# Patient Record
Sex: Male | Born: 1955
Health system: Southern US, Community
[De-identification: ages and names within clinical notes are randomized; demographics above are authoritative.]

## PROBLEM LIST (undated history)

## (undated) ENCOUNTER — Emergency Department (HOSPITAL_COMMUNITY): Discharge status: Absent without leave | Payer: Medicare Other

## (undated) DIAGNOSIS — I2510 Atherosclerotic heart disease of native coronary artery without angina pectoris: Secondary | ICD-10-CM

## (undated) DIAGNOSIS — R569 Unspecified convulsions: Secondary | ICD-10-CM

## (undated) DIAGNOSIS — I219 Acute myocardial infarction, unspecified: Secondary | ICD-10-CM

## (undated) DIAGNOSIS — K219 Gastro-esophageal reflux disease without esophagitis: Secondary | ICD-10-CM

## (undated) DIAGNOSIS — J449 Chronic obstructive pulmonary disease, unspecified: Secondary | ICD-10-CM

## (undated) DIAGNOSIS — Z9119 Patient's noncompliance with other medical treatment and regimen: Secondary | ICD-10-CM

## (undated) DIAGNOSIS — I509 Heart failure, unspecified: Secondary | ICD-10-CM

## (undated) DIAGNOSIS — IMO0001 Reserved for inherently not codable concepts without codable children: Secondary | ICD-10-CM

## (undated) DIAGNOSIS — I201 Angina pectoris with documented spasm: Secondary | ICD-10-CM

## (undated) DIAGNOSIS — I639 Cerebral infarction, unspecified: Secondary | ICD-10-CM

## (undated) DIAGNOSIS — I10 Essential (primary) hypertension: Secondary | ICD-10-CM

## (undated) DIAGNOSIS — Z91199 Patient's noncompliance with other medical treatment and regimen due to unspecified reason: Secondary | ICD-10-CM

## (undated) DIAGNOSIS — F419 Anxiety disorder, unspecified: Secondary | ICD-10-CM

## (undated) HISTORY — PX: CORONARY ARTERY BYPASS GRAFT: SHX141

## (undated) HISTORY — PX: OTHER SURGICAL HISTORY: SHX169

## (undated) HISTORY — PX: MOUTH SURGERY: SHX715

---

## 2010-08-15 DIAGNOSIS — K219 Gastro-esophageal reflux disease without esophagitis: Secondary | ICD-10-CM | POA: Insufficient documentation

## 2013-03-20 DIAGNOSIS — Z7982 Long term (current) use of aspirin: Secondary | ICD-10-CM | POA: Diagnosis not present

## 2013-03-20 DIAGNOSIS — M549 Dorsalgia, unspecified: Secondary | ICD-10-CM | POA: Diagnosis not present

## 2013-03-20 DIAGNOSIS — I10 Essential (primary) hypertension: Secondary | ICD-10-CM | POA: Diagnosis not present

## 2013-03-20 DIAGNOSIS — R6889 Other general symptoms and signs: Secondary | ICD-10-CM | POA: Diagnosis not present

## 2013-03-20 DIAGNOSIS — M545 Low back pain, unspecified: Secondary | ICD-10-CM | POA: Diagnosis not present

## 2013-03-20 DIAGNOSIS — K219 Gastro-esophageal reflux disease without esophagitis: Secondary | ICD-10-CM | POA: Diagnosis not present

## 2013-03-20 DIAGNOSIS — M542 Cervicalgia: Secondary | ICD-10-CM | POA: Diagnosis not present

## 2013-03-20 DIAGNOSIS — Z951 Presence of aortocoronary bypass graft: Secondary | ICD-10-CM | POA: Diagnosis not present

## 2013-03-20 DIAGNOSIS — Z79899 Other long term (current) drug therapy: Secondary | ICD-10-CM | POA: Diagnosis not present

## 2013-03-20 DIAGNOSIS — I252 Old myocardial infarction: Secondary | ICD-10-CM | POA: Diagnosis not present

## 2013-03-20 DIAGNOSIS — IMO0002 Reserved for concepts with insufficient information to code with codable children: Secondary | ICD-10-CM | POA: Diagnosis not present

## 2013-04-27 DIAGNOSIS — I10 Essential (primary) hypertension: Secondary | ICD-10-CM | POA: Diagnosis not present

## 2013-04-27 DIAGNOSIS — J449 Chronic obstructive pulmonary disease, unspecified: Secondary | ICD-10-CM | POA: Diagnosis not present

## 2013-04-27 DIAGNOSIS — K219 Gastro-esophageal reflux disease without esophagitis: Secondary | ICD-10-CM | POA: Diagnosis not present

## 2013-04-27 DIAGNOSIS — J0100 Acute maxillary sinusitis, unspecified: Secondary | ICD-10-CM | POA: Diagnosis not present

## 2013-11-19 ENCOUNTER — Emergency Department (HOSPITAL_COMMUNITY): Payer: Medicare Other

## 2013-11-19 ENCOUNTER — Encounter (HOSPITAL_COMMUNITY): Payer: Self-pay | Admitting: Emergency Medicine

## 2013-11-19 ENCOUNTER — Emergency Department (HOSPITAL_COMMUNITY)
Admission: EM | Admit: 2013-11-19 | Discharge: 2013-11-19 | Disposition: A | Discharge status: Home / Self Care | Payer: Medicare Other | Attending: Emergency Medicine | Admitting: Emergency Medicine

## 2013-11-19 DIAGNOSIS — Z951 Presence of aortocoronary bypass graft: Secondary | ICD-10-CM | POA: Diagnosis not present

## 2013-11-19 DIAGNOSIS — I10 Essential (primary) hypertension: Secondary | ICD-10-CM | POA: Insufficient documentation

## 2013-11-19 DIAGNOSIS — J441 Chronic obstructive pulmonary disease with (acute) exacerbation: Secondary | ICD-10-CM | POA: Diagnosis not present

## 2013-11-19 DIAGNOSIS — F17200 Nicotine dependence, unspecified, uncomplicated: Secondary | ICD-10-CM | POA: Diagnosis not present

## 2013-11-19 DIAGNOSIS — I252 Old myocardial infarction: Secondary | ICD-10-CM | POA: Insufficient documentation

## 2013-11-19 DIAGNOSIS — IMO0002 Reserved for concepts with insufficient information to code with codable children: Secondary | ICD-10-CM | POA: Diagnosis not present

## 2013-11-19 DIAGNOSIS — I509 Heart failure, unspecified: Secondary | ICD-10-CM | POA: Diagnosis not present

## 2013-11-19 DIAGNOSIS — J449 Chronic obstructive pulmonary disease, unspecified: Secondary | ICD-10-CM | POA: Diagnosis not present

## 2013-11-19 DIAGNOSIS — R0602 Shortness of breath: Secondary | ICD-10-CM | POA: Diagnosis not present

## 2013-11-19 DIAGNOSIS — Z79899 Other long term (current) drug therapy: Secondary | ICD-10-CM | POA: Insufficient documentation

## 2013-11-19 DIAGNOSIS — R091 Pleurisy: Secondary | ICD-10-CM | POA: Diagnosis not present

## 2013-11-19 DIAGNOSIS — Z8673 Personal history of transient ischemic attack (TIA), and cerebral infarction without residual deficits: Secondary | ICD-10-CM | POA: Insufficient documentation

## 2013-11-19 HISTORY — DX: Acute myocardial infarction, unspecified: I21.9

## 2013-11-19 HISTORY — DX: Essential (primary) hypertension: I10

## 2013-11-19 HISTORY — DX: Heart failure, unspecified: I50.9

## 2013-11-19 HISTORY — DX: Chronic obstructive pulmonary disease, unspecified: J44.9

## 2013-11-19 HISTORY — DX: Unspecified convulsions: R56.9

## 2013-11-19 HISTORY — DX: Cerebral infarction, unspecified: I63.9

## 2013-11-19 LAB — CBC
HCT: 46.1 % (ref 39.0–52.0)
Hemoglobin: 15.5 g/dL (ref 13.0–17.0)
MCH: 32 pg (ref 26.0–34.0)
MCHC: 33.6 g/dL (ref 30.0–36.0)
MCV: 95.1 fL (ref 78.0–100.0)
PLATELETS: 280 10*3/uL (ref 150–400)
RBC: 4.85 MIL/uL (ref 4.22–5.81)
RDW: 14.8 % (ref 11.5–15.5)
WBC: 8.3 10*3/uL (ref 4.0–10.5)

## 2013-11-19 LAB — BASIC METABOLIC PANEL
Anion gap: 14 (ref 5–15)
BUN: 9 mg/dL (ref 6–23)
CHLORIDE: 105 meq/L (ref 96–112)
CO2: 20 mEq/L (ref 19–32)
Calcium: 9.5 mg/dL (ref 8.4–10.5)
Creatinine, Ser: 0.95 mg/dL (ref 0.50–1.35)
GFR calc non Af Amer: 89 mL/min — ABNORMAL LOW (ref >90)
Glucose, Bld: 101 mg/dL — ABNORMAL HIGH (ref 70–99)
POTASSIUM: 5.4 meq/L — AB (ref 3.7–5.3)
SODIUM: 139 meq/L (ref 137–147)

## 2013-11-19 LAB — PRO B NATRIURETIC PEPTIDE: PRO B NATRI PEPTIDE: 33 pg/mL (ref 0–125)

## 2013-11-19 LAB — I-STAT TROPONIN, ED: TROPONIN I, POC: 0.01 ng/mL (ref 0.00–0.08)

## 2013-11-19 MED ORDER — ALBUTEROL SULFATE HFA 108 (90 BASE) MCG/ACT IN AERS: 1.0000 | INHALATION_SPRAY | Freq: Four times a day (QID) | RESPIRATORY_TRACT | Status: DC | PRN

## 2013-11-19 MED ORDER — PREDNISONE 20 MG PO TABS: ORAL_TABLET | ORAL | Status: DC

## 2013-11-19 MED ORDER — IPRATROPIUM-ALBUTEROL 0.5-2.5 (3) MG/3ML IN SOLN
3.0000 mL | RESPIRATORY_TRACT | Status: DC
  Administered 2013-11-19: 3 mL via RESPIRATORY_TRACT
  Filled 2013-11-19: qty 3

## 2013-11-19 MED ORDER — ACETAMINOPHEN 325 MG PO TABS: 650.0000 mg | ORAL_TABLET | Freq: Once | ORAL | Status: DC

## 2013-11-19 MED ORDER — PREDNISONE 20 MG PO TABS
60.0000 mg | ORAL_TABLET | Freq: Once | ORAL | Status: AC
  Administered 2013-11-19: 60 mg via ORAL
  Filled 2013-11-19: qty 3

## 2013-11-19 MED ORDER — ALBUTEROL SULFATE (2.5 MG/3ML) 0.083% IN NEBU
5.0000 mg | INHALATION_SOLUTION | RESPIRATORY_TRACT | Status: DC | PRN
  Administered 2013-11-19: 5 mg via RESPIRATORY_TRACT
  Filled 2013-11-19: qty 6

## 2013-11-19 MED ORDER — AZITHROMYCIN 250 MG PO TABS: ORAL_TABLET | ORAL | Status: DC

## 2013-11-19 MED ORDER — ACETAMINOPHEN 500 MG PO TABS
1000.0000 mg | ORAL_TABLET | Freq: Once | ORAL | Status: AC
  Administered 2013-11-19: 1000 mg via ORAL
  Filled 2013-11-19: qty 2

## 2013-11-19 MED ORDER — ACETAMINOPHEN 500 MG PO TABS: 500.0000 mg | ORAL_TABLET | Freq: Four times a day (QID) | ORAL | Status: DC | PRN

## 2013-11-19 NOTE — ED Provider Notes (Signed)
CSN: [id number]     Arrival date & time 11/19/13  1012 History   First MD Initiated Contact with Patient 11/19/13 1034     Chief Complaint  Patient presents with  . Chest Pain  . Shortness of Breath     (Consider location/radiation/quality/duration/timing/severity/associated sxs/prior Treatment) HPI The patient reports that he has had a cough for about a week now. He describes it is productive with yellow sputum. Patient reports he has felt short of breath, particularly with exertion. Patient reports he does have COPD and continues to smoke about a pack every few days. The patient reports that this morning he woke up at about 4AM with chest pain that is an ache on his right side. The pain does not radiate. He reports that when he goes into a coughing episode both sides of his chest hurts. He reports that he has wheezing which is particularly bad at nighttime. He also reports he is out of his inhaler. The patient does have a history of significant cardiac disease. He denies swelling or pain in his legs.  Past Medical History  Diagnosis Date  . Hypertension   . Seizures   . Myocardial infarction   . Stroke   . CHF (congestive heart failure)   . COPD (chronic obstructive pulmonary disease)    Past Surgical History  Procedure Laterality Date  . Open heart surgery     History reviewed. No pertinent family history. History  Substance Use Topics  . Smoking status: Current Every Day Smoker  . Smokeless tobacco: Not on file  . Alcohol Use: Yes    Review of Systems 10 Systems reviewed and are negative for acute change except as noted in the HPI.    Allergies  Dilantin and Vicodin  Home Medications   Prior to Admission medications   Medication Sig Start Date End Date Taking? Authorizing Provider  albuterol (PROVENTIL HFA;VENTOLIN HFA) 108 (90 BASE) MCG/ACT inhaler Inhale 2 puffs into the lungs every 6 (six) hours as needed for wheezing or shortness of breath.   Yes Historical  Provider, MD  atorvastatin (LIPITOR) 40 MG tablet Take 40 mg by mouth daily.   Yes Historical Provider, MD  Cyanocobalamin (VITAMIN B 12 PO) Take 1 tablet by mouth daily.   Yes Historical Provider, MD  Fluticasone-Salmeterol (ADVAIR) 250-50 MCG/DOSE AEPB Inhale 1 puff into the lungs 2 (two) times daily.   Yes Historical Provider, MD  hydrochlorothiazide (HYDRODIURIL) 25 MG tablet Take 12.5 mg by mouth daily.   Yes Historical Provider, MD  lisinopril (PRINIVIL,ZESTRIL) 10 MG tablet Take 10 mg by mouth daily.   Yes Historical Provider, MD  Menthol (COUGH DROPS) 10 MG LOZG Use as directed 1 lozenge in the mouth or throat as needed (for cough).   Yes Historical Provider, MD  Multiple Vitamins-Minerals (CENTRUM ADULTS PO) Take 1 tablet by mouth daily.   Yes Historical Provider, MD  nitroGLYCERIN (NITROSTAT) 0.4 MG SL tablet Place 0.4 mg under the tongue every 5 (five) minutes as needed for chest pain.   Yes Historical Provider, MD  pantoprazole (PROTONIX) 40 MG tablet Take 40 mg by mouth daily.   Yes Historical Provider, MD  acetaminophen (TYLENOL) 500 MG tablet Take 1 tablet (500 mg total) by mouth every 6 (six) hours as needed. 11/19/13   Charlesetta Shanks, MD  albuterol (PROVENTIL HFA;VENTOLIN HFA) 108 (90 BASE) MCG/ACT inhaler Inhale 1-2 puffs into the lungs every 6 (six) hours as needed for wheezing or shortness of breath. 11/19/13   Charlesetta Shanks,  MD  azithromycin (ZITHROMAX Z-PAK) 250 MG tablet 2 po day one, then 1 daily x 4 days 11/19/13   Charlesetta Shanks, MD  predniSONE (DELTASONE) 20 MG tablet 3 tabs po day one, then 2 tabs daily x 4 days 11/19/13   Charlesetta Shanks, MD   BP 144/89  Pulse 83  Temp(Src) 98.3 F (36.8 C) (Oral)  Resp 20  SpO2 94% Physical Exam  Constitutional: He is oriented to person, place, and time. He appears well-developed and well-nourished.  No respiratory distress nontoxic  HENT:  Head: Normocephalic and atraumatic.  Oropharynx widely patent, no erythema or exudate. Nares  patent without drainage discharge or bogginess  Eyes: EOM are normal. Pupils are equal, round, and reactive to light.  Neck: Neck supple.  Cardiovascular: Normal rate and regular rhythm.   Pulmonary/Chest: Effort normal. No respiratory distress. He has wheezes (patient does have wheeze, fine expiratory more pronounced on forced exhalation. Somewhat greater on left than right).  Abdominal: Soft. Bowel sounds are normal. He exhibits no distension. There is no tenderness.  Musculoskeletal: Normal range of motion. He exhibits no edema and no tenderness.  The calves are soft and nontender without any edema.  Neurological: He is alert and oriented to person, place, and time.  Skin: Skin is warm and dry.  Psychiatric: He has a normal mood and affect.     ED Course  Procedures (including critical care time) Labs Review Labs Reviewed  BASIC METABOLIC PANEL - Abnormal; Notable for the following:    Potassium 5.4 (*)    Glucose, Bld 101 (*)    GFR calc non Af Amer 89 (*)    All other components within normal limits  CBC  PRO B NATRIURETIC PEPTIDE  I-STAT TROPOININ, ED    Imaging Review Dg Chest Port 1 View  11/19/2013   CLINICAL DATA:  Smoker, history COPD, hypertension, MI, CHF, stroke  EXAM: PORTABLE CHEST - 1 VIEW  COMPARISON:  Portable exam 1042 hr without priors for comparison  FINDINGS: Normal heart size, mediastinal contours, and pulmonary vascularity.  Postsurgical changes of CABG.  Lungs clear.  No pleural effusion or pneumothorax.  Bones unremarkable.  IMPRESSION: Post CABG.  No acute abnormalities.   Electronically Signed   By: Lavonia Dana M.D.   On: 11/19/2013 10:53     EKG Interpretation   Date/Time:  Wednesday November 19 2013 10:19:53 EDT Ventricular Rate:  93 PR Interval:  136 QRS Duration: 100 QT Interval:  373 QTC Calculation: 464 R Axis:   86 Text Interpretation:  Sinus rhythm Probable left atrial enlargement  Borderline right axis deviation no STEMI.  INTERVENTRICULAR CUNDUCTION  DELAY. Confirmed by Johnney Killian, MD, Jeannie Done [phone]) on 11/19/2013 10:38:30 AM      MDM   Final diagnoses:  COPD with acute exacerbation  Pleurisy   At this point time with diagnostic studies reviewed in history present illness the symptoms do seem most consistent with COPD exacerbation with pleurisy. The patient will be treated with prednisone and a Z-Pak. His inhaler will be refilled.    Charlesetta Shanks, MD 11/19/13 [phone]

## 2013-11-19 NOTE — ED Notes (Signed)
Pt reports that he feels better after his breathing treatment.

## 2013-11-19 NOTE — Progress Notes (Signed)
Medical screening examination/treatment/procedure(s) were performed by non-physician practitioner and as supervising physician I was immediately available for consultation/collaboration.   EKG Interpretation   Date/Time:  Wednesday November 19 2013 10:19:53 EDT Ventricular Rate:  93 PR Interval:  136 QRS Duration: 100 QT Interval:  373 QTC Calculation: 464 R Axis:   86 Text Interpretation:  Sinus rhythm Probable left atrial enlargement  Borderline right axis deviation no STEMI. INTERVENTRICULAR CUNDUCTION  DELAY. Confirmed by Johnney Killian, MD, Jeannie Done [phone]) on 11/19/2013 10:38:30 AM

## 2013-11-19 NOTE — ED Notes (Signed)
Respiratory called to evaluate pt  

## 2013-11-19 NOTE — ED Notes (Signed)
Respiratory called for treatment.

## 2013-11-19 NOTE — ED Notes (Signed)
Pt states he began having rt sided cp, described as an ache, since 0400 this morning.  Extensive cardiac hx w/ of 7 stents. Also c/o SOB w/ productive cough, yellow sputum.

## 2013-11-19 NOTE — Discharge Instructions (Signed)
Pleurisy Pleurisy is an inflammation and swelling of the lining of the lungs (pleura). Because of this inflammation, it hurts to breathe. It can be aggravated by coughing, laughing, or deep breathing. Pleurisy is often caused by an underlying infection or disease.  HOME CARE INSTRUCTIONS  Monitor your pleurisy for any changes. The following actions may help to alleviate any discomfort you are experiencing:  Medicine may help with pain. Only take over-the-counter or prescription medicines for pain, discomfort, or fever as directed by your health care provider.  Only take antibiotic medicine as directed. Make sure to finish it even if you start to feel better. SEEK MEDICAL CARE IF:   Your pain is not controlled with medicine or is increasing.  You have an increase in pus-like (purulent) secretions brought up with coughing. SEEK IMMEDIATE MEDICAL CARE IF:   You have blue or dark lips, fingernails, or toenails.  You are coughing up blood.  You have increased difficulty breathing.  You have continuing pain unrelieved by medicine or pain lasting more than 1 week.  You have pain that radiates into your neck, arms, or jaw.  You develop increased shortness of breath or wheezing.  You develop a fever, rash, vomiting, fainting, or other serious symptoms. MAKE SURE YOU:  Understand these instructions.   Will watch your condition.   Will get help right away if you are not doing well or get worse.  Document Released: 02/06/2005 Document Revised: 10/09/2012 Document Reviewed: 07/21/2012 [facility] Patient Information 2015 [place], [place]. This information is not intended to replace advice given to you by your health care provider. Make sure you discuss any questions you have with your health care provider. Chronic Obstructive Pulmonary Disease Chronic obstructive pulmonary disease (COPD) is a common lung condition in which airflow from the lungs is limited. COPD is a general term that can be  used to describe many different lung problems that limit airflow, including both chronic bronchitis and emphysema. If you have COPD, your lung function will probably never return to normal, but there are measures you can take to improve lung function and make yourself feel better.  CAUSES   Smoking (common).   Exposure to secondhand smoke.   Genetic problems.  Chronic inflammatory lung diseases or recurrent infections. SYMPTOMS   Shortness of breath, especially with physical activity.   Deep, persistent (chronic) cough with a large amount of thick mucus.   Wheezing.   Rapid breaths (tachypnea).   Gray or bluish discoloration (cyanosis) of the skin, especially in fingers, toes, or lips.   Fatigue.   Weight loss.   Frequent infections or episodes when breathing symptoms become much worse (exacerbations).   Chest tightness. DIAGNOSIS  Your health care provider will take a medical history and perform a physical examination to make the initial diagnosis. Additional tests for COPD may include:   Lung (pulmonary) function tests.  Chest X-ray.  CT scan.  Blood tests. TREATMENT  Treatment available to help you feel better when you have COPD includes:   Inhaler and nebulizer medicines. These help manage the symptoms of COPD and make your breathing more comfortable.  Supplemental oxygen. Supplemental oxygen is only helpful if you have a low oxygen level in your blood.   Exercise and physical activity. These are beneficial for nearly all people with COPD. Some people may also benefit from a pulmonary rehabilitation program. HOME CARE INSTRUCTIONS   Take all medicines (inhaled or pills) as directed by your health care provider.  Avoid over-the-counter medicines or cough  syrups that dry up your airway (such as antihistamines) and slow down the elimination of secretions unless instructed otherwise by your health care provider.   If you are a smoker, the most  important thing that you can do is stop smoking. Continuing to smoke will cause further lung damage and breathing trouble. Ask your health care provider for help with quitting smoking. He or she can direct you to community resources or hospitals that provide support.  Avoid exposure to irritants such as smoke, chemicals, and fumes that aggravate your breathing.  Use oxygen therapy and pulmonary rehabilitation if directed by your health care provider. If you require home oxygen therapy, ask your health care provider whether you should purchase a pulse oximeter to measure your oxygen level at home.   Avoid contact with individuals who have a contagious illness.  Avoid extreme temperature and humidity changes.  Eat healthy foods. Eating smaller, more frequent meals and resting before meals may help you maintain your strength.  Stay active, but balance activity with periods of rest. Exercise and physical activity will help you maintain your ability to do things you want to do.  Preventing infection and hospitalization is very important when you have COPD. Make sure to receive all the vaccines your health care provider recommends, especially the pneumococcal and influenza vaccines. Ask your health care provider whether you need a pneumonia vaccine.  Learn and use relaxation techniques to manage stress.  Learn and use controlled breathing techniques as directed by your health care provider. Controlled breathing techniques include:   Pursed lip breathing. Start by breathing in (inhaling) through your nose for 1 second. Then, purse your lips as if you were going to whistle and breathe out (exhale) through the pursed lips for 2 seconds.   Diaphragmatic breathing. Start by putting one hand on your abdomen just above your waist. Inhale slowly through your nose. The hand on your abdomen should move out. Then purse your lips and exhale slowly. You should be able to feel the hand on your abdomen moving in  as you exhale.   Learn and use controlled coughing to clear mucus from your lungs. Controlled coughing is a series of short, progressive coughs. The steps of controlled coughing are:  1. Lean your head slightly forward.  2. Breathe in deeply using diaphragmatic breathing.  3. Try to hold your breath for 3 seconds.  4. Keep your mouth slightly open while coughing twice.  5. Spit any mucus out into a tissue.  6. Rest and repeat the steps once or twice as needed. SEEK MEDICAL CARE IF:   You are coughing up more mucus than usual.   There is a change in the color or thickness of your mucus.   Your breathing is more labored than usual.   Your breathing is faster than usual.  SEEK IMMEDIATE MEDICAL CARE IF:   You have shortness of breath while you are resting.   You have shortness of breath that prevents you from:  Being able to talk.   Performing your usual physical activities.   You have chest pain lasting longer than 5 minutes.   Your skin color is more cyanotic than usual.  You measure low oxygen saturations for longer than 5 minutes with a pulse oximeter. MAKE SURE YOU:   Understand these instructions.  Will watch your condition.  Will get help right away if you are not doing well or get worse. Document Released: 11/16/2004 Document Revised: 06/23/2013 Document Reviewed: 10/03/2012 [employer] Patient Information  2015 [employer]. This information is not intended to replace advice given to you by your health care provider. Make sure you discuss any questions you have with your health care provider.

## 2013-11-19 NOTE — ED Notes (Signed)
Pt requesting pain medication. MD made aware. 

## 2013-11-19 NOTE — ED Notes (Signed)
CXR at bedside, MD at bedside

## 2013-11-19 NOTE — ED Notes (Signed)
Pt reports he is leaving with ALL belongings he arrived with. Pt ambulatory to lobby with visitor.

## 2013-11-19 NOTE — Progress Notes (Signed)
Called be RN to evaluate patient with HX of copd . Patient is still a current smoker but says he gets really tight in his chest at night. BBS initially without wheeze but due to hx. Ordered 5mg  of albuterol per protocol. Patient stated he felt better after tx. Will continue to monitor.

## 2013-11-19 NOTE — Progress Notes (Signed)
Reinstructed patient on use of proper technique with an inhaler and spacer.

## 2013-11-19 NOTE — ED Notes (Signed)
Respiratory at bedside.

## 2014-02-16 ENCOUNTER — Emergency Department (HOSPITAL_COMMUNITY): Payer: Medicare Other

## 2014-02-16 ENCOUNTER — Observation Stay (HOSPITAL_COMMUNITY)
Admission: EM | Admit: 2014-02-16 | Discharge: 2014-02-19 | Disposition: A | Discharge status: Home / Self Care | Payer: Medicare Other | Attending: Internal Medicine | Admitting: Internal Medicine

## 2014-02-16 ENCOUNTER — Encounter (HOSPITAL_COMMUNITY): Payer: Self-pay | Admitting: *Deleted

## 2014-02-16 DIAGNOSIS — I10 Essential (primary) hypertension: Secondary | ICD-10-CM | POA: Diagnosis not present

## 2014-02-16 DIAGNOSIS — R0602 Shortness of breath: Secondary | ICD-10-CM | POA: Insufficient documentation

## 2014-02-16 DIAGNOSIS — N179 Acute kidney failure, unspecified: Secondary | ICD-10-CM | POA: Insufficient documentation

## 2014-02-16 DIAGNOSIS — Z7951 Long term (current) use of inhaled steroids: Secondary | ICD-10-CM | POA: Diagnosis not present

## 2014-02-16 DIAGNOSIS — R0902 Hypoxemia: Secondary | ICD-10-CM

## 2014-02-16 DIAGNOSIS — R1010 Upper abdominal pain, unspecified: Secondary | ICD-10-CM | POA: Diagnosis not present

## 2014-02-16 DIAGNOSIS — Z8673 Personal history of transient ischemic attack (TIA), and cerebral infarction without residual deficits: Secondary | ICD-10-CM | POA: Insufficient documentation

## 2014-02-16 DIAGNOSIS — R0789 Other chest pain: Secondary | ICD-10-CM | POA: Diagnosis present

## 2014-02-16 DIAGNOSIS — K5730 Diverticulosis of large intestine without perforation or abscess without bleeding: Secondary | ICD-10-CM | POA: Insufficient documentation

## 2014-02-16 DIAGNOSIS — J441 Chronic obstructive pulmonary disease with (acute) exacerbation: Secondary | ICD-10-CM | POA: Diagnosis not present

## 2014-02-16 DIAGNOSIS — R1013 Epigastric pain: Secondary | ICD-10-CM | POA: Diagnosis not present

## 2014-02-16 DIAGNOSIS — E860 Dehydration: Secondary | ICD-10-CM | POA: Insufficient documentation

## 2014-02-16 DIAGNOSIS — I252 Old myocardial infarction: Secondary | ICD-10-CM | POA: Insufficient documentation

## 2014-02-16 DIAGNOSIS — I509 Heart failure, unspecified: Secondary | ICD-10-CM | POA: Diagnosis not present

## 2014-02-16 DIAGNOSIS — F419 Anxiety disorder, unspecified: Secondary | ICD-10-CM | POA: Diagnosis not present

## 2014-02-16 DIAGNOSIS — Z23 Encounter for immunization: Secondary | ICD-10-CM | POA: Insufficient documentation

## 2014-02-16 DIAGNOSIS — Z9114 Patient's other noncompliance with medication regimen: Secondary | ICD-10-CM | POA: Diagnosis not present

## 2014-02-16 DIAGNOSIS — R109 Unspecified abdominal pain: Secondary | ICD-10-CM

## 2014-02-16 DIAGNOSIS — I2510 Atherosclerotic heart disease of native coronary artery without angina pectoris: Secondary | ICD-10-CM | POA: Diagnosis not present

## 2014-02-16 DIAGNOSIS — F17210 Nicotine dependence, cigarettes, uncomplicated: Secondary | ICD-10-CM | POA: Diagnosis not present

## 2014-02-16 DIAGNOSIS — K219 Gastro-esophageal reflux disease without esophagitis: Secondary | ICD-10-CM | POA: Insufficient documentation

## 2014-02-16 DIAGNOSIS — R05 Cough: Secondary | ICD-10-CM | POA: Diagnosis not present

## 2014-02-16 DIAGNOSIS — F17200 Nicotine dependence, unspecified, uncomplicated: Secondary | ICD-10-CM | POA: Diagnosis present

## 2014-02-16 DIAGNOSIS — F1010 Alcohol abuse, uncomplicated: Secondary | ICD-10-CM | POA: Insufficient documentation

## 2014-02-16 DIAGNOSIS — R079 Chest pain, unspecified: Secondary | ICD-10-CM | POA: Diagnosis not present

## 2014-02-16 DIAGNOSIS — J449 Chronic obstructive pulmonary disease, unspecified: Secondary | ICD-10-CM | POA: Insufficient documentation

## 2014-02-16 DIAGNOSIS — R200 Anesthesia of skin: Secondary | ICD-10-CM | POA: Diagnosis not present

## 2014-02-16 DIAGNOSIS — I519 Heart disease, unspecified: Secondary | ICD-10-CM | POA: Diagnosis not present

## 2014-02-16 DIAGNOSIS — Z951 Presence of aortocoronary bypass graft: Secondary | ICD-10-CM | POA: Diagnosis not present

## 2014-02-16 DIAGNOSIS — Z7952 Long term (current) use of systemic steroids: Secondary | ICD-10-CM | POA: Insufficient documentation

## 2014-02-16 DIAGNOSIS — J9601 Acute respiratory failure with hypoxia: Principal | ICD-10-CM | POA: Insufficient documentation

## 2014-02-16 DIAGNOSIS — E785 Hyperlipidemia, unspecified: Secondary | ICD-10-CM | POA: Diagnosis not present

## 2014-02-16 DIAGNOSIS — J9600 Acute respiratory failure, unspecified whether with hypoxia or hypercapnia: Secondary | ICD-10-CM | POA: Diagnosis present

## 2014-02-16 DIAGNOSIS — Z79899 Other long term (current) drug therapy: Secondary | ICD-10-CM | POA: Diagnosis not present

## 2014-02-16 HISTORY — DX: Anxiety disorder, unspecified: F41.9

## 2014-02-16 HISTORY — DX: Angina pectoris with documented spasm: I20.1

## 2014-02-16 HISTORY — DX: Gastro-esophageal reflux disease without esophagitis: K21.9

## 2014-02-16 HISTORY — DX: Patient's noncompliance with other medical treatment and regimen: Z91.19

## 2014-02-16 HISTORY — DX: Patient's noncompliance with other medical treatment and regimen due to unspecified reason: Z91.199

## 2014-02-16 HISTORY — DX: Atherosclerotic heart disease of native coronary artery without angina pectoris: I25.10

## 2014-02-16 HISTORY — DX: Reserved for inherently not codable concepts without codable children: IMO0001

## 2014-02-16 LAB — I-STAT TROPONIN, ED
TROPONIN I, POC: 0.01 ng/mL (ref 0.00–0.08)
Troponin i, poc: 0 ng/mL (ref 0.00–0.08)

## 2014-02-16 LAB — TROPONIN I: Troponin I: <0.03 ng/mL (ref <0.031)

## 2014-02-16 LAB — BASIC METABOLIC PANEL
Anion gap: 12 (ref 5–15)
BUN: 17 mg/dL (ref 6–23)
CHLORIDE: 105 meq/L (ref 96–112)
CO2: 22 mmol/L (ref 19–32)
Calcium: 9.1 mg/dL (ref 8.4–10.5)
Creatinine, Ser: 1.18 mg/dL (ref 0.50–1.35)
GFR calc non Af Amer: 65 mL/min — ABNORMAL LOW (ref >90)
GFR, EST AFRICAN AMERICAN: 76 mL/min — AB (ref >90)
Glucose, Bld: 100 mg/dL — ABNORMAL HIGH (ref 70–99)
POTASSIUM: 4.1 mmol/L (ref 3.5–5.1)
Sodium: 139 mmol/L (ref 135–145)

## 2014-02-16 LAB — CBC
HEMATOCRIT: 46.9 % (ref 39.0–52.0)
Hemoglobin: 15.8 g/dL (ref 13.0–17.0)
MCH: 32.2 pg (ref 26.0–34.0)
MCHC: 33.7 g/dL (ref 30.0–36.0)
MCV: 95.5 fL (ref 78.0–100.0)
Platelets: 318 10*3/uL (ref 150–400)
RBC: 4.91 MIL/uL (ref 4.22–5.81)
RDW: 14.7 % (ref 11.5–15.5)
WBC: 9.2 10*3/uL (ref 4.0–10.5)

## 2014-02-16 LAB — LIPID PANEL
Cholesterol: 226 mg/dL — ABNORMAL HIGH (ref 0–200)
HDL: 60 mg/dL (ref >39)
LDL Cholesterol: 125 mg/dL — ABNORMAL HIGH (ref 0–99)
Total CHOL/HDL Ratio: 3.8 RATIO
Triglycerides: 204 mg/dL — ABNORMAL HIGH (ref <150)
VLDL: 41 mg/dL — AB (ref 0–40)

## 2014-02-16 LAB — ETHANOL: Alcohol, Ethyl (B): <5 mg/dL (ref 0–9)

## 2014-02-16 LAB — RAPID URINE DRUG SCREEN, HOSP PERFORMED
AMPHETAMINES: NOT DETECTED
Barbiturates: NOT DETECTED
Benzodiazepines: NOT DETECTED
Cocaine: NOT DETECTED
OPIATES: POSITIVE — AB
TETRAHYDROCANNABINOL: NOT DETECTED

## 2014-02-16 LAB — BRAIN NATRIURETIC PEPTIDE: B Natriuretic Peptide: 13.4 pg/mL (ref 0.0–100.0)

## 2014-02-16 LAB — LIPASE, BLOOD: Lipase: 38 U/L (ref 11–59)

## 2014-02-16 MED ORDER — LORAZEPAM 1 MG PO TABS
1.0000 mg | ORAL_TABLET | Freq: Four times a day (QID) | ORAL | Status: AC | PRN
  Administered 2014-02-18: 1 mg via ORAL
  Filled 2014-02-16: qty 1

## 2014-02-16 MED ORDER — ENOXAPARIN SODIUM 40 MG/0.4ML ~~LOC~~ SOLN
40.0000 mg | SUBCUTANEOUS | Status: DC
  Administered 2014-02-16 – 2014-02-18 (×3): 40 mg via SUBCUTANEOUS
  Filled 2014-02-16 (×4): qty 0.4

## 2014-02-16 MED ORDER — MORPHINE SULFATE 4 MG/ML IJ SOLN
4.0000 mg | Freq: Once | INTRAMUSCULAR | Status: AC
Start: 2014-02-16 — End: 2014-02-16
  Administered 2014-02-16: 4 mg via INTRAVENOUS
  Filled 2014-02-16: qty 1

## 2014-02-16 MED ORDER — GUAIFENESIN-DM 100-10 MG/5ML PO SYRP
5.0000 mL | ORAL_SOLUTION | ORAL | Status: DC
Start: 2014-02-16 — End: 2014-02-17
  Administered 2014-02-16 – 2014-02-17 (×5): 5 mL via ORAL
  Filled 2014-02-16 (×11): qty 5

## 2014-02-16 MED ORDER — FOLIC ACID 1 MG PO TABS
1.0000 mg | ORAL_TABLET | Freq: Every day | ORAL | Status: DC
  Administered 2014-02-16 – 2014-02-19 (×4): 1 mg via ORAL
  Filled 2014-02-16 (×4): qty 1

## 2014-02-16 MED ORDER — ADULT MULTIVITAMIN W/MINERALS CH
1.0000 | ORAL_TABLET | Freq: Every day | ORAL | Status: DC
  Administered 2014-02-16 – 2014-02-19 (×4): 1 via ORAL
  Filled 2014-02-16 (×4): qty 1

## 2014-02-16 MED ORDER — IPRATROPIUM-ALBUTEROL 0.5-2.5 (3) MG/3ML IN SOLN
3.0000 mL | Freq: Three times a day (TID) | RESPIRATORY_TRACT | Status: DC
  Administered 2014-02-17: 3 mL via RESPIRATORY_TRACT

## 2014-02-16 MED ORDER — PANTOPRAZOLE SODIUM 40 MG PO TBEC
40.0000 mg | DELAYED_RELEASE_TABLET | Freq: Every day | ORAL | Status: DC
  Administered 2014-02-16 – 2014-02-19 (×4): 40 mg via ORAL
  Filled 2014-02-16 (×3): qty 1

## 2014-02-16 MED ORDER — IPRATROPIUM-ALBUTEROL 0.5-2.5 (3) MG/3ML IN SOLN: 3.0000 mL | Freq: Once | RESPIRATORY_TRACT | Status: DC

## 2014-02-16 MED ORDER — MOMETASONE FURO-FORMOTEROL FUM 100-5 MCG/ACT IN AERO
2.0000 | INHALATION_SPRAY | Freq: Two times a day (BID) | RESPIRATORY_TRACT | Status: DC
  Administered 2014-02-16 – 2014-02-18 (×5): 2 via RESPIRATORY_TRACT
  Filled 2014-02-16: qty 8.8

## 2014-02-16 MED ORDER — ATORVASTATIN CALCIUM 40 MG PO TABS
40.0000 mg | ORAL_TABLET | Freq: Every day | ORAL | Status: DC
  Administered 2014-02-16 – 2014-02-19 (×4): 40 mg via ORAL
  Filled 2014-02-16 (×4): qty 1

## 2014-02-16 MED ORDER — PREDNISONE 20 MG PO TABS
40.0000 mg | ORAL_TABLET | Freq: Every day | ORAL | Status: DC
  Administered 2014-02-16 – 2014-02-19 (×3): 40 mg via ORAL
  Filled 2014-02-16 (×5): qty 2

## 2014-02-16 MED ORDER — NITROGLYCERIN 0.4 MG SL SUBL
0.4000 mg | SUBLINGUAL_TABLET | SUBLINGUAL | Status: DC | PRN
  Administered 2014-02-16 – 2014-02-17 (×4): 0.4 mg via SUBLINGUAL
  Filled 2014-02-16 (×2): qty 1

## 2014-02-16 MED ORDER — LEVOFLOXACIN 750 MG PO TABS
750.0000 mg | ORAL_TABLET | Freq: Every day | ORAL | Status: DC
  Administered 2014-02-17 – 2014-02-19 (×3): 750 mg via ORAL
  Filled 2014-02-16 (×4): qty 1

## 2014-02-16 MED ORDER — INFLUENZA VAC SPLIT QUAD 0.5 ML IM SUSY
0.5000 mL | PREFILLED_SYRINGE | INTRAMUSCULAR | Status: DC
  Filled 2014-02-16: qty 0.5

## 2014-02-16 MED ORDER — LORAZEPAM 2 MG/ML IJ SOLN: 1.0000 mg | Freq: Four times a day (QID) | INTRAMUSCULAR | Status: AC | PRN

## 2014-02-16 MED ORDER — ALBUTEROL SULFATE (2.5 MG/3ML) 0.083% IN NEBU: 2.5000 mg | INHALATION_SOLUTION | RESPIRATORY_TRACT | Status: DC | PRN

## 2014-02-16 MED ORDER — MORPHINE SULFATE 2 MG/ML IJ SOLN
0.5000 mg | INTRAMUSCULAR | Status: DC | PRN
  Administered 2014-02-16 – 2014-02-19 (×8): 1 mg via INTRAVENOUS
  Filled 2014-02-16 (×8): qty 1

## 2014-02-16 MED ORDER — ASPIRIN EC 81 MG PO TBEC
81.0000 mg | DELAYED_RELEASE_TABLET | Freq: Every day | ORAL | Status: DC
  Administered 2014-02-17 – 2014-02-19 (×3): 81 mg via ORAL
  Filled 2014-02-16 (×3): qty 1

## 2014-02-16 MED ORDER — ASPIRIN 81 MG PO CHEW
324.0000 mg | CHEWABLE_TABLET | Freq: Once | ORAL | Status: AC
  Administered 2014-02-16: 324 mg via ORAL
  Filled 2014-02-16: qty 4

## 2014-02-16 MED ORDER — THIAMINE HCL 100 MG/ML IJ SOLN
100.0000 mg | Freq: Every day | INTRAMUSCULAR | Status: DC
  Filled 2014-02-16 (×2): qty 1

## 2014-02-16 MED ORDER — SODIUM CHLORIDE 0.9 % IV SOLN
INTRAVENOUS | Status: AC
  Administered 2014-02-16: 17:00:00 via INTRAVENOUS

## 2014-02-16 MED ORDER — IPRATROPIUM-ALBUTEROL 0.5-2.5 (3) MG/3ML IN SOLN
3.0000 mL | RESPIRATORY_TRACT | Status: DC
  Administered 2014-02-16 (×2): 3 mL via RESPIRATORY_TRACT
  Filled 2014-02-16 (×2): qty 3

## 2014-02-16 MED ORDER — SODIUM CHLORIDE 0.9 % IV BOLUS (SEPSIS)
500.0000 mL | Freq: Once | INTRAVENOUS | Status: AC
  Administered 2014-02-16: 500 mL via INTRAVENOUS

## 2014-02-16 MED ORDER — VITAMIN B-1 100 MG PO TABS
100.0000 mg | ORAL_TABLET | Freq: Every day | ORAL | Status: DC
  Administered 2014-02-17 – 2014-02-19 (×3): 100 mg via ORAL
  Filled 2014-02-16 (×4): qty 1

## 2014-02-16 MED ORDER — ALBUTEROL (5 MG/ML) CONTINUOUS INHALATION SOLN
10.0000 mg/h | INHALATION_SOLUTION | Freq: Once | RESPIRATORY_TRACT | Status: AC
  Administered 2014-02-16: 10 mg/h via RESPIRATORY_TRACT
  Filled 2014-02-16: qty 20

## 2014-02-16 NOTE — ED Notes (Signed)
Woke up at 0500 and started having sharp stabbing intermittent chest pain and reports productive cough with white sputum.  Denies swelling.  Pt reports sob with reclining.  Arrived on nebulizer and alert.

## 2014-02-16 NOTE — ED Notes (Signed)
Attempted report x1. 

## 2014-02-16 NOTE — ED Provider Notes (Signed)
CSN: [id number]     Arrival date & time 02/16/14  9983 History   First MD Initiated Contact with Patient 02/16/14 0701     No chief complaint on file.    (Consider location/radiation/quality/duration/timing/severity/associated sxs/prior Treatment) HPI Comments: 58 yo male with hx presents with cough and sharp chest pain.  Pt has had productive cough for almost one month with intermittent right sided chest pain, no radiation. Mild right hand numbness intermittent.  Pt not on abx, Patient denies blood clot history, active cancer, recent major trauma or surgery, unilateral leg swelling/ pain, recent long travel, hemoptysis or oral contraceptives. CP worse with coughing/ movement.  Pt has cardiologist in [place] Dr Lissa Merlin, no recent cardiac issues or work up.  CP not exertional.  CABG hx.     The history is provided by the patient.    Past Medical History  Diagnosis Date  . Hypertension   . Seizures   . Myocardial infarction   . Stroke   . CHF (congestive heart failure)   . COPD (chronic obstructive pulmonary disease)    Past Surgical History  Procedure Laterality Date  . Open heart surgery     No family history on file. History  Substance Use Topics  . Smoking status: Current Every Day Smoker  . Smokeless tobacco: Not on file  . Alcohol Use: Yes    Review of Systems  Constitutional: Negative for fever and chills.  HENT: Negative for congestion.   Eyes: Negative for visual disturbance.  Respiratory: Positive for cough and shortness of breath.   Cardiovascular: Positive for chest pain. Negative for leg swelling.  Gastrointestinal: Negative for vomiting and abdominal pain.  Genitourinary: Negative for dysuria and flank pain.  Musculoskeletal: Negative for back pain, neck pain and neck stiffness.  Skin: Negative for rash.  Neurological: Negative for light-headedness and headaches.      Allergies  Dilantin and Vicodin  Home Medications   Prior to Admission  medications   Medication Sig Start Date End Date Taking? Authorizing Provider  acetaminophen (TYLENOL) 500 MG tablet Take 1 tablet (500 mg total) by mouth every 6 (six) hours as needed. 11/19/13  Yes Charlesetta Shanks, MD  albuterol (PROVENTIL HFA;VENTOLIN HFA) 108 (90 BASE) MCG/ACT inhaler Inhale 1-2 puffs into the lungs every 6 (six) hours as needed for wheezing or shortness of breath. 11/19/13  Yes Charlesetta Shanks, MD  Cyanocobalamin (VITAMIN B 12 PO) Take 1 tablet by mouth daily.   Yes Historical Provider, MD  Fluticasone-Salmeterol (ADVAIR) 250-50 MCG/DOSE AEPB Inhale 1 puff into the lungs 2 (two) times daily.   Yes Historical Provider, MD  lisinopril (PRINIVIL,ZESTRIL) 10 MG tablet Take 10 mg by mouth daily.   Yes Historical Provider, MD  Menthol (COUGH DROPS) 10 MG LOZG Use as directed 1 lozenge in the mouth or throat as needed (for cough).   Yes Historical Provider, MD  Multiple Vitamins-Minerals (CENTRUM ADULTS PO) Take 1 tablet by mouth daily.   Yes Historical Provider, MD  nitroGLYCERIN (NITROSTAT) 0.4 MG SL tablet Place 0.4 mg under the tongue every 5 (five) minutes as needed for chest pain.   Yes Historical Provider, MD  pantoprazole (PROTONIX) 40 MG tablet Take 40 mg by mouth daily.   Yes Historical Provider, MD  albuterol (PROVENTIL HFA;VENTOLIN HFA) 108 (90 BASE) MCG/ACT inhaler Inhale 2 puffs into the lungs every 6 (six) hours as needed for wheezing or shortness of breath.    Historical Provider, MD  atorvastatin (LIPITOR) 40 MG tablet Take 40 mg by  mouth daily.    Historical Provider, MD  azithromycin (ZITHROMAX Z-PAK) 250 MG tablet 2 po day one, then 1 daily x 4 days Patient not taking: Reported on 02/16/2014 11/19/13   Charlesetta Shanks, MD  hydrochlorothiazide (HYDRODIURIL) 25 MG tablet Take 12.5 mg by mouth daily.    Historical Provider, MD  predniSONE (DELTASONE) 20 MG tablet 3 tabs po day one, then 2 tabs daily x 4 days Patient not taking: Reported on 02/16/2014 11/19/13   Charlesetta Shanks, MD   BP 128/63 mmHg  Pulse 118  Temp(Src) 97.6 F (36.4 C) (Oral)  Resp 21  SpO2 85% Physical Exam  Constitutional: He is oriented to person, place, and time. He appears well-developed and well-nourished.  HENT:  Head: Normocephalic and atraumatic.  Eyes: Right eye exhibits no discharge. Left eye exhibits no discharge.  Neck: Normal range of motion. Neck supple. No tracheal deviation present.  Cardiovascular: Normal rate and regular rhythm.   Pulmonary/Chest: He has wheezes (mild tachypnea).  Abdominal: Soft. He exhibits no distension. There is no tenderness. There is no guarding.  Musculoskeletal: He exhibits no edema or tenderness.  Neurological: He is alert and oriented to person, place, and time.  Skin: Skin is warm. No rash noted.  Psychiatric: He has a normal mood and affect.  Nursing note and vitals reviewed.   ED Course  Procedures (including critical care time) Labs Review Labs Reviewed  BASIC METABOLIC PANEL - Abnormal; Notable for the following:    Glucose, Bld 100 (*)    GFR calc non Af Amer 65 (*)    GFR calc Af Amer 76 (*)    All other components within normal limits  CBC  BRAIN NATRIURETIC PEPTIDE  I-STAT TROPOININ, ED  Randolm Idol, ED    Imaging Review Dg Chest Port 1 View  02/16/2014   CLINICAL DATA:  Shortness of breath for 4 days.  Chest pain.  EXAM: PORTABLE CHEST - 1 VIEW  COMPARISON:  11/19/2013  FINDINGS: Mild interstitial coarsening at the bases, suspect a smoking manifestation. There is no edema or convincing pneumonia. No effusion or pneumothorax.  Normal heart size and mediastinal contours. The patient is status post CABG.  IMPRESSION: Stable exam.  No acute cardiopulmonary disease.   Electronically Signed   By: Jorje Guild M.D.   On: 02/16/2014 06:59     EKG Interpretation   Date/Time:  Monday February 16 2014 06:39:17 EST Ventricular Rate:  95 PR Interval:  154 QRS Duration: 100 QT Interval:  371 QTC Calculation:  466 R Axis:   86 Text Interpretation:  Sinus rhythm Left atrial enlargement Borderline  right axis deviation Poor baseline Confirmed by Treshaun Carrico  MD, Tiffony Kite ([id number])  on 02/16/2014 7:04:23 AM      MDM   Final diagnoses:  Hypoxia  Acute exacerbation of chronic obstructive pulmonary disease (COPD)  Chest pain, atypical   Patient with significant COPD and cardiac history presents with intermittent sharp right-sided chest pain with productive cough. Clinically concern for bronchitis/community acquired pneumonia however cardiac history and atypical chest pain plan for delta troponin. No classic PE risks.  EKG reviewed no acute ST elevation or depression. Continuous neb, patient received neb and steroids prior to arrival. Pain medicines ordered. Discussed close outpatient follow-up if vitals remain okay and patient not requiring oxygen.  Patient felt improved after continuous neb however heart rate increased likely from albuterol and oxygen dropped to upper 80s. Patient stabilized with 1-2 L nasal cannula. Discussed with internal medicine resident for medicine  admission.  The patients results and plan were reviewed and discussed.   Any x-rays performed were personally reviewed by myself.   Differential diagnosis were considered with the presenting HPI.  Medications  sodium chloride 0.9 % bolus 500 mL (not administered)  albuterol (PROVENTIL) (2.5 MG/3ML) 0.083% nebulizer solution 2.5 mg (not administered)  morphine 4 MG/ML injection 4 mg (4 mg Intravenous Given 02/16/14 0750)  aspirin chewable tablet 324 mg (324 mg Oral Given 02/16/14 0748)  albuterol (PROVENTIL,VENTOLIN) solution continuous neb (10 mg/hr Nebulization Given 02/16/14 0752)    Filed Vitals:   02/16/14 0830 02/16/14 0845 02/16/14 0908 02/16/14 0927  BP: 123/63 128/63    Pulse: 97 97 118   Temp:      TempSrc:      Resp: 18 21    SpO2: 96% 97% 96% 85%    Final diagnoses:  Hypoxia  Acute exacerbation of chronic  obstructive pulmonary disease (COPD)  Chest pain, atypical    Admission/ observation were discussed with the admitting physician, patient and/or family and they are comfortable with the plan.    Mariea Clonts, MD 02/16/14 [phone]

## 2014-02-16 NOTE — ED Notes (Signed)
Heart healthy tray ordered for pt.  

## 2014-02-16 NOTE — ED Notes (Signed)
Pt has had Albuterol 10mg , atrovent 0.5, 125mg  solumedrol.

## 2014-02-16 NOTE — ED Notes (Signed)
Blankets given to pt; pt resting, no needs at this time

## 2014-02-16 NOTE — H&P (Signed)
Date: 02/16/2014               Patient Name:  Gregory Pena MRN: [id number]  DOB: 1955-12-10 Age / Sex: 58 y.o., male   PCP: No primary care provider on file.         Medical Service: Internal Medicine Teaching Service         Attending Physician: Dr. Axel Filler, MD    First Contact: Dr. Arcelia Jew Pager: [phone]  Second Contact: Dr. Denton Brick Pager: [phone]       After Hours (After 5p/  First Contact Pager: [phone]  weekends / holidays): Second Contact Pager: [phone]   Chief Complaint: SOB, cough, and chest pain  History of Present Illness: Gregory Pena is a 58 year old smoking male with PMH of HTN, COPD, and CAD s/p CABG presenting to the ED today for complaints of worsening sob, productive cough, and right sided chest pain. He says he woke up around 5am this morning coughing with sharp right sided chest pain.  He has been having worsening coughing spells with increased productive sputum (occasinally some red speckles--also when he blows his nose, clear, or yellow-green at times).  The coughing is associated with SOB and chest pain/soreness.  He claims he has been coughing more for the past 6 months but recently worse with sputum and SOB. He continues to smoke cigarettes, approximately 1 pack lasting 2 days with 30+ years of hx.  Of note, he does recall having been told he has COPD and believes he had lung function testing done after MI but does not recall the results. Mr. Atallah has been using his albuterol inhaler, twice yesterday, but has not been using his Advair.   In regards to his chest pain--mostly right sided, but occasionally on the left, worse with coughing, improves when coughing stops. Currently 6/10 and improving with morphine.  Tender to  Palpation. Similar prior episode in September with coughing and presumed COPD exacerbation as well.  He admits to dyspnea on exertion, especially when riding his bike and sometimes has chest tightness with sob at those times.  Hx of CABG  approximately 5 years ago, treated by  Dr. Lissa Merlin from Cardiology at [facility] in [place]. He claims he last saw a Cardiologist last year and has not been compliant with this BP and cholesterol medication but plans to restart. He also does not have a current pcp and wishes to establish care soon. He was given names to offices in the past but did not follow though due to insurance requirements and questions. Of note, he does claim to have heart failure but does not recall the specifics.   Finally, he also endorses occasional right finger numbness more than left and sometimes in his toes as well. He says the numbness occasionally moves to the left side. He denies hx of diabetes. He reports having heart burn controlled with protonix, and sometimes wakes up in a sweat.  Last alcohol use was 7pm last night with 1 beer, daily drinker of 1-2 beers that he says he has been drinking his whole life. Has not used cocaine for approximately 2 years and last use of marijuana approximately 6 months ago. Last BM this morning, denies diarrhea.   In the ED, he is noted to have drop in o2 sats to 86% on room air after continuous nebulizer treatment and occasionally dropped to 88% on room air during our conversation. O2 sats 92-96% on 2L Oxygen.   Similar ED visit September  2015 treated with zpack, short prednisone taper, and inhaler refills.   Meds: Current Facility-Administered Medications  Medication Dose Route Frequency Provider Last Rate Last Dose  . albuterol (PROVENTIL) (2.5 MG/3ML) 0.083% nebulizer solution 2.5 mg  2.5 mg Nebulization Q4H PRN Mariea Clonts, MD      . sodium chloride 0.9 % bolus 500 mL  500 mL Intravenous Once Mariea Clonts, MD       Current Outpatient Prescriptions  Medication Sig Dispense Refill  . acetaminophen (TYLENOL) 500 MG tablet Take 1 tablet (500 mg total) by mouth every 6 (six) hours as needed. 30 tablet 0  . albuterol (PROVENTIL HFA;VENTOLIN HFA) 108 (90 BASE)  MCG/ACT inhaler Inhale 1-2 puffs into the lungs every 6 (six) hours as needed for wheezing or shortness of breath. 1 Inhaler 0  . Cyanocobalamin (VITAMIN B 12 PO) Take 1 tablet by mouth daily.    . Fluticasone-Salmeterol (ADVAIR) 250-50 MCG/DOSE AEPB Inhale 1 puff into the lungs 2 (two) times daily.    Marland Kitchen lisinopril (PRINIVIL,ZESTRIL) 10 MG tablet Take 10 mg by mouth daily.    . Menthol (COUGH DROPS) 10 MG LOZG Use as directed 1 lozenge in the mouth or throat as needed (for cough).    . Multiple Vitamins-Minerals (CENTRUM ADULTS PO) Take 1 tablet by mouth daily.    . nitroGLYCERIN (NITROSTAT) 0.4 MG SL tablet Place 0.4 mg under the tongue every 5 (five) minutes as needed for chest pain.    . pantoprazole (PROTONIX) 40 MG tablet Take 40 mg by mouth daily.    Marland Kitchen albuterol (PROVENTIL HFA;VENTOLIN HFA) 108 (90 BASE) MCG/ACT inhaler Inhale 2 puffs into the lungs every 6 (six) hours as needed for wheezing or shortness of breath.    Marland Kitchen atorvastatin (LIPITOR) 40 MG tablet Take 40 mg by mouth daily.    Marland Kitchen azithromycin (ZITHROMAX Z-PAK) 250 MG tablet 2 po day one, then 1 daily x 4 days (Patient not taking: Reported on 02/16/2014) 5 tablet 0  . hydrochlorothiazide (HYDRODIURIL) 25 MG tablet Take 12.5 mg by mouth daily.    . predniSONE (DELTASONE) 20 MG tablet 3 tabs po day one, then 2 tabs daily x 4 days (Patient not taking: Reported on 02/16/2014) 11 tablet 0   Allergies: Allergies as of 02/16/2014 - Review Complete 02/16/2014  Allergen Reaction Noted  . Dilantin [phenytoin] Other (See Comments) 11/19/2013  . Vicodin [hydrocodone-acetaminophen] Hives 11/19/2013   Past Medical History  Diagnosis Date  . Hypertension   . Seizures   . Myocardial infarction   . Stroke   . CHF (congestive heart failure)   . COPD (chronic obstructive pulmonary disease)    Past Surgical History  Procedure Laterality Date  . Open heart surgery     No family history on file. History   Social History  . Marital Status:  Single    Spouse Name: N/A    Number of Children: N/A  . Years of Education: N/A   Occupational History  . Not on file.   Social History Main Topics  . Smoking status: Current Every Day Smoker  . Smokeless tobacco: Not on file  . Alcohol Use: Yes  . Drug Use: Yes    Special: Marijuana  . Sexual Activity: Not on file   Other Topics Concern  . Not on file   Social History Narrative   Review of Systems:  Constitutional:  ?Subjective fever-feels warm all of a sudden, occasional night sweats  HEENT:  Denies sore throat  Respiratory:  SOB, DOE, Cough, and Wheezing  Cardiovascular:  Right sided chest pain  Gastrointestinal:  Abdominal pain and heart burn. Denies nausea, vomiting, constipation.  Musculoskeletal:  Hands shaky   Skin:  Dry  Neurological:  Numbness in fingers and toes, occasional headaches--not at this time. No recent seizures   Physical Exam: Blood pressure 128/63, pulse 118, temperature 97.6 F (36.4 C), temperature source Oral, resp. rate 21, SpO2 85 %. Vitals reviewed. General: resting in bed, NAD, 2L [place] O2, occasional coughing spells during conversation, speaking in full sentences HEENT: EOMI, dry tongue, slight droop of right eye lid that patient says is not new Cardiac: Tachycardia Pulm: b/l expiratory wheezing, louder upper lobes, some rhonchi Abd: soft, tenderness to palpation epigastric region, +bs, some guarding during examination Ext: moving all extremities, fingers b/l edematous per patient, -edema or tenderness to palpation of lower extremties Neuro: alert and oriented X3, strength equal in b/l extremities, finger to nose intact but hands shaking, no asterixis, equal sensation in face, but increased sensation endorsed on right arm and leg compared to left extremities. Sensation intact in toes and fingers but feels like they are a little numb compared to the right side.  Able to make a fist and following all commands.  No tongue deviation or facial droop,  no slurred speech but does mumble at times making it difficult to understand him, able to close eyes, frown, puff cheeks, and shrug shoulders.   Lab results: Basic Metabolic Panel:  Recent Labs  02/16/14 0644  NA 139  K 4.1  CL 105  CO2 22  GLUCOSE 100*  BUN 17  CREATININE 1.18  CALCIUM 9.1   CBC:  Recent Labs  02/16/14 0644  WBC 9.2  HGB 15.8  HCT 46.9  MCV 95.5  PLT 318   Misc. Labs: Troponin i, poc 0.01 BNP 13.4  Imaging results:  Dg Chest Port 1 View  02/16/2014   CLINICAL DATA:  Shortness of breath for 4 days.  Chest pain.  EXAM: PORTABLE CHEST - 1 VIEW  COMPARISON:  11/19/2013  FINDINGS: Mild interstitial coarsening at the bases, suspect a smoking manifestation. There is no edema or convincing pneumonia. No effusion or pneumothorax.  Normal heart size and mediastinal contours. The patient is status post CABG.  IMPRESSION: Stable exam.  No acute cardiopulmonary disease.   Electronically Signed   By: Jorje Guild M.D.   On: 02/16/2014 06:59   Other results: EKG: 95bpm, NSR, ?motion artifact in A, III, and V1, non-specific t wave changes in I, aVF, and V4 when compared to prior  Assessment & Plan by Problem: Principal Problem:   Acute exacerbation of chronic obstructive pulmonary disease (COPD) Active Problems:   CAD (coronary artery disease)   HTN (hypertension)   Atypical chest pain   Acute respiratory failure   Alcohol abuse   Tobacco abuse   Abdominal pain, epigastric  Mr. Preslar is a 58 year old chronic smoker with hx of CAD and ?COPD admitted for hypoxia and atypical chest pain.   Acute respiratory failure in setting of presumed COPD exacerbation--chronic smoker, recent worsening of SOB at baseline and DOE, coughing spells with productive sputum increased in quantity and changing of color, and wheezing. O2 sats down to 86% in room air, improved to 92-96% on 2L [place] O2.  Likely secondary to viral URI/bronchitis but symptoms have been progressively  worsening for the pat 6 months per patient. Non-complaint with medications including Advair but does have albuterol inhaler that he has been using.  CXR with interstitial coarsening at bases but noted to have no edema and not convincing of PNA (no fever or leukocytosis).  Breathing has improved after continuous nebulizer treatment in ED per patient. SOB/DOE could also be due to CHF, however, less suspicion of an acute CHF exacerbation at this time with no effusion or obvious edema on CXR, appears dry on physical exam, and BNP of 13.4.  PE also seems less likely, however is intermediate probability based on geneva score due to HR after albuterol treatment and occasionally has noticed speckled of blood in sputum or nasal congestion. He denies recent surgeries and has no lower extremity edema or tenderness to palpation but has traveled recently on christmas to [facility] and [place] by car.  He appears comfortable at this time with improved breathing and appears to have a more likely diagnosis, however, if in respiratory distress or worsening, consider PE as more likely diagnosis and proceed with further work up but caution with CT contrast study given rise in Cr.  -admit to tele -duonebz q4h for now with caution for tachycardia and can transition to prn once improving -robitussin cough syrup, scheduled for now and transition to prn as improving -restart advair -continuous pulse ox, continue  O2--currently at 2L, keep o2 sats >90-92% for now -check pulse ox with ambulation in AM -daily weights -will start levaquin 750mg  po daily  Given that he has all 3 cardinal symptoms including dyspnea, increased sputum volume and purulence and also has cardiac disease.  -try to obtain records from [facility], may have PFTs and old echo, otherwise consider ordering this admission or on hospital follow up (needs PCP) -NS 500cc bolus started in ED--will complete bolus as he does appear dry, then can start regular diet and  drinking water -counseled on smoking cessation -hold off on steroids for now given improvement on wheezing after nebulizer treatment and good air movement throughout lungs on physical exam, but will have primary team re-evaluate and may consider starting -continue to monitor respiratory status closely, day team to reassess--could consider abg if no improvement or worsening -echo, can also help to assess possible heart strain  Atypical CP--right sided, likely in setting of cough. TTP on right side, worse with coughing suggestive of possible musculoskeletal etiology. Improving with morphine and as cough improves. But he does have hx of CAD s/p CABG, HTN, continues to smoke cigarettes, and is non-compliant with medications.  EKG with non-specific t wave changes and tachycardia after nebulizer treatment. Poc troponin's have been negative.  -should improve with respiratory treatment as per above -cycle CE -try to obtain prior records, otherwise consider echo while inpatient (ordered for now but will have primary team discuss further) -restart statin -ASA 325mg  given in ED, start 81mg   -hold BB in setting of cocaine hx use for now, re-visit pending UDS results -AM EKG -morphine prn pain if needed -nitro prn pain, caution for hypotension -check lipid panel and A1C  Acute kidney injury--likely in the setting of dehydration with decreased fluid intake and frequent alcohol use. BUN up to 17 from November and Cr up to 1.18 (from 0.95) -continue NS bolus from ED and will continue NS @75cc  x12 hours and re-evaluate -encouraged po intake -AM CMET  Alcohol abuse--daily drinker for all his life per patient, reports 1-2 beers per day but possibly more. Last drink 7pm last night, says 1 beer, but smells of alcohol this morning.  -hydrate with IVF -will place on CIWA protocol -can check ethanol level  Abdominal pain--epigastric,  tenderness to palpation. Chronic and intermittent and does not radiate.  Possibly chronic pancreatitis? Vs. GERD vs. PUD (does occasionally take NSAIDs).  He denies constipation, last BM this morning without diarrhea. Passing gas with bowel sounds present.  -continue protonix -check lipase -continue to monitor   ?Neuropathy--possibly secondary to diabetes, more stocking and glove pattern radiating upwards from toes and fingers, more on right side than left but does occasionally move to left side per patient. Also has hx of chronic alcohol use.  -check a1c -continue to monitor -on CIWA -will have primary team assess gait when he is able to walk around and coughing less -consider checking b12 level and/or folate and consider restarting b12 supplementation  HTN--on Lisinopril 10mg  and HCTZ 25mg  daily per home medication on EPIC, however, he has not been taking any of these medications for several months. Not hypertensive at this time, SBP 120s/60s.  -holding home medications for now, ACE due to mild AKI and HCTZ as he may be a little dehydrated -did not start BB given hx of cocaine abuse pending UDS -continue to monitor BP during admission and restart medications as needed  Seizures--hx of seizures but says has not been on any medication for quite some time and does not recall what medication he was on. Denies any recent seizure activity. Does occasionally have headaches that resolves with Advil.  -monitor for possible seizure activity, seizure precautions  Diet: Regular DVT Ppx: Lovenox Dispo: Disposition is deferred at this time, awaiting improvement of current medical problems. Anticipated discharge in approximately 1-2 day(s).   The patient does not have a current PCP (No primary care provider on file.) and does need an [facility] hospital follow-up appointment after discharge.  The patient does not have transportation limitations that hinder transportation to clinic appointments.  Signed: Wilber Oliphant, MD 02/16/2014, 9:41 AM

## 2014-02-16 NOTE — ED Notes (Signed)
Pt aware of need of urine specimen; pt does have an urinal at bedside and stated "I ain't got to pee"

## 2014-02-16 NOTE — Progress Notes (Signed)
  Echocardiogram 2D Echocardiogram has been performed.  Gregory Pena 02/16/2014, 2:04 PM

## 2014-02-16 NOTE — ED Notes (Signed)
Pt finished hour neb and states feels a little better.  Sats dropped to 93% RA with ambulating, pt complain or sob and dizziness. Will notify EDP

## 2014-02-17 DIAGNOSIS — J449 Chronic obstructive pulmonary disease, unspecified: Secondary | ICD-10-CM | POA: Diagnosis not present

## 2014-02-17 DIAGNOSIS — R109 Unspecified abdominal pain: Secondary | ICD-10-CM | POA: Insufficient documentation

## 2014-02-17 DIAGNOSIS — I10 Essential (primary) hypertension: Secondary | ICD-10-CM

## 2014-02-17 DIAGNOSIS — J9601 Acute respiratory failure with hypoxia: Secondary | ICD-10-CM | POA: Diagnosis not present

## 2014-02-17 DIAGNOSIS — R0602 Shortness of breath: Secondary | ICD-10-CM | POA: Diagnosis not present

## 2014-02-17 DIAGNOSIS — F1010 Alcohol abuse, uncomplicated: Secondary | ICD-10-CM

## 2014-02-17 DIAGNOSIS — R05 Cough: Secondary | ICD-10-CM | POA: Diagnosis not present

## 2014-02-17 LAB — COMPREHENSIVE METABOLIC PANEL
ALT: 30 U/L (ref 0–53)
AST: 26 U/L (ref 0–37)
Albumin: 3.7 g/dL (ref 3.5–5.2)
Alkaline Phosphatase: 52 U/L (ref 39–117)
Anion gap: 10 (ref 5–15)
BUN: 14 mg/dL (ref 6–23)
CALCIUM: 9.2 mg/dL (ref 8.4–10.5)
CO2: 19 mmol/L (ref 19–32)
Chloride: 108 mEq/L (ref 96–112)
Creatinine, Ser: 0.89 mg/dL (ref 0.50–1.35)
GFR calc Af Amer: >90 mL/min (ref >90)
GFR calc non Af Amer: >90 mL/min (ref >90)
Glucose, Bld: 130 mg/dL — ABNORMAL HIGH (ref 70–99)
Potassium: 4.2 mmol/L (ref 3.5–5.1)
Sodium: 137 mmol/L (ref 135–145)
TOTAL PROTEIN: 6.3 g/dL (ref 6.0–8.3)
Total Bilirubin: 0.4 mg/dL (ref 0.3–1.2)

## 2014-02-17 LAB — HEMOGLOBIN A1C
Hgb A1c MFr Bld: 6.2 % — ABNORMAL HIGH (ref <5.7)
Mean Plasma Glucose: 131 mg/dL — ABNORMAL HIGH (ref <117)

## 2014-02-17 LAB — TROPONIN I

## 2014-02-17 LAB — HIV ANTIBODY (ROUTINE TESTING W REFLEX): HIV 1&2 Ab, 4th Generation: NONREACTIVE

## 2014-02-17 MED ORDER — ALBUTEROL SULFATE (2.5 MG/3ML) 0.083% IN NEBU: 2.5000 mg | INHALATION_SOLUTION | RESPIRATORY_TRACT | Status: DC | PRN

## 2014-02-17 MED ORDER — GUAIFENESIN-CODEINE 100-10 MG/5ML PO SOLN
5.0000 mL | Freq: Four times a day (QID) | ORAL | Status: DC
  Administered 2014-02-17 – 2014-02-19 (×10): 5 mL via ORAL
  Filled 2014-02-17 (×10): qty 5

## 2014-02-17 MED ORDER — IPRATROPIUM-ALBUTEROL 0.5-2.5 (3) MG/3ML IN SOLN
3.0000 mL | RESPIRATORY_TRACT | Status: DC
  Administered 2014-02-17 (×3): 3 mL via RESPIRATORY_TRACT
  Filled 2014-02-17 (×3): qty 3

## 2014-02-17 MED ORDER — IPRATROPIUM-ALBUTEROL 0.5-2.5 (3) MG/3ML IN SOLN
3.0000 mL | Freq: Four times a day (QID) | RESPIRATORY_TRACT | Status: DC
  Administered 2014-02-18 (×3): 3 mL via RESPIRATORY_TRACT
  Filled 2014-02-17 (×3): qty 3

## 2014-02-17 NOTE — Progress Notes (Signed)
Internal Medicine Attending  Date: 02/17/2014  Patient name: Gregory Pena Medical record number: [id number] Date of birth: 1955/07/13 Age: 58 y.o. Gender: male  I saw and evaluated the patient. I discussed patient and reviewed the resident's note by Dr. Arcelia Jew, and I agree with the resident's findings and plans as documented in her note, with the following additional comments.  Patient's abdominal pain has improved to some extent, but he still has periumbilical and right upper quadrant tenderness, and he says the abdominal pain is chronic.  Would proceed with CT scan of the abdomen and pelvis to assess.

## 2014-02-17 NOTE — Progress Notes (Signed)
Pt was given prn NTG x3. VSS. Pt CP was 5/10 after 3rd. O2 was decreased from 4 to 2L today. Increased O2 back to 4L. EKG showed NSR. Dr. Trudee Kuster notified of all pertinent information and no further orders were given at this time.

## 2014-02-17 NOTE — Progress Notes (Signed)
Went to room to assess pt. Pt c/o 9/10 CP. Pt states he feels the pain with inspiration. Pt is on 2L O2. Pt given 1 mg morphine IV. Breathing treatments are due and RT is now in room to administer. EKG is being obtained and MD will be notified with results.

## 2014-02-17 NOTE — Progress Notes (Signed)
UR completed 

## 2014-02-17 NOTE — Progress Notes (Signed)
Subjective: Patient reports his chest pain has improved. He notes his chest pain is still right-sided and worse with deep inspiration and tender to palpation of chest. He notes his epigastric abdominal pain has improved, but is still bothering him. He denies any fever, chills, nausea, and vomiting.   Objective: Vital signs in last 24 hours: Filed Vitals:   02/17/14 0024 02/17/14 0620 02/17/14 0816 02/17/14 0926  BP: 139/82 130/73  168/78  Pulse: 107 103  124  Temp: 98.4 F (36.9 C) 97.9 F (36.6 C)    TempSrc: Oral Oral    Resp: 20 20  28   Height:      Weight:  74.617 kg (164 lb 8 oz)    SpO2: 93% 96% 94% 100%   Weight change:   Intake/Output Summary (Last 24 hours) at 02/17/14 1208 Last data filed at 02/17/14 0932  Gross per 24 hour  Intake    360 ml  Output   1000 ml  Net   -640 ml   Physical Exam General: resting in bed, NAD HEENT: [place]/AT, EOMI, sclera anicteric, mucus membranes moist CV: tachycardic, no m/g/r Pulm: wheezes bilaterally, breaths non-labored, on 2 L oxygen via [place] Abd: BS+, soft, distention improved from yesterday, tenderness to palpation of epigastrium Ext: warm, no edema, moves all Neuro: alert and oriented x 3, CNs II-XII intact  Lab Results: Basic Metabolic Panel:  Recent Labs Lab 02/16/14 0644 02/17/14 0259  NA 139 137  K 4.1 4.2  CL 105 108  CO2 22 19  GLUCOSE 100* 130*  BUN 17 14  CREATININE 1.18 0.89  CALCIUM 9.1 9.2   Liver Function Tests:  Recent Labs Lab 02/17/14 0259  AST 26  ALT 30  ALKPHOS 52  BILITOT 0.4  PROT 6.3  ALBUMIN 3.7    Recent Labs Lab 02/16/14 1521  LIPASE 38   CBC:  Recent Labs Lab 02/16/14 0644  WBC 9.2  HGB 15.8  HCT 46.9  MCV 95.5  PLT 318   Cardiac Enzymes:  Recent Labs Lab 02/16/14 1521 02/16/14 2035 02/17/14 0259  TROPONINI <0.03 <0.03 <0.03   Hemoglobin A1C:  Recent Labs Lab 02/16/14 1521  HGBA1C 6.2*   Fasting Lipid Panel:  Recent Labs Lab 02/16/14 1521  CHOL  226*  HDL 60  LDLCALC 125*  TRIG 204*  CHOLHDL 3.8   Urine Drug Screen: Drugs of Abuse     Component Value Date/Time   LABOPIA POSITIVE* 02/16/2014 1352   COCAINSCRNUR NONE DETECTED 02/16/2014 1352   LABBENZ NONE DETECTED 02/16/2014 1352   AMPHETMU NONE DETECTED 02/16/2014 1352   THCU NONE DETECTED 02/16/2014 1352   LABBARB NONE DETECTED 02/16/2014 1352    Alcohol Level:  Recent Labs Lab 02/16/14 [place] <5   Studies/Results: Dg Chest Port 1 View  02/16/2014   CLINICAL DATA:  Shortness of breath for 4 days.  Chest pain.  EXAM: PORTABLE CHEST - 1 VIEW  COMPARISON:  11/19/2013  FINDINGS: Mild interstitial coarsening at the bases, suspect a smoking manifestation. There is no edema or convincing pneumonia. No effusion or pneumothorax.  Normal heart size and mediastinal contours. The patient is status post CABG.  IMPRESSION: Stable exam.  No acute cardiopulmonary disease.   Electronically Signed   By: Jorje Guild M.D.   On: 02/16/2014 06:59   Medications: I have reviewed the patient's current medications. Scheduled Meds: . aspirin EC  81 mg Oral Daily  . atorvastatin  40 mg Oral q1800  . enoxaparin (LOVENOX) injection  40 mg Subcutaneous Q24H  . folic acid  1 mg Oral Daily  . guaiFENesin-codeine  5 mL Oral Q6H  . Influenza vac split quadrivalent PF  0.5 mL Intramuscular Tomorrow-1000  . ipratropium-albuterol  3 mL Nebulization Q4H  . levofloxacin  750 mg Oral Daily  . mometasone-formoterol  2 puff Inhalation BID  . multivitamin with minerals  1 tablet Oral Daily  . pantoprazole  40 mg Oral Daily  . predniSONE  40 mg Oral Q breakfast  . thiamine  100 mg Oral Daily   Or  . thiamine  100 mg Intravenous Daily   Continuous Infusions:  PRN Meds:.LORazepam **OR** LORazepam, morphine injection, nitroGLYCERIN Assessment/Plan: Principal Problem:   Acute exacerbation of chronic obstructive pulmonary disease (COPD) Active Problems:   CAD (coronary artery disease)   HTN  (hypertension)   Atypical chest pain   Acute respiratory failure   Alcohol abuse   Tobacco abuse   Abdominal pain, epigastric  Acute respiratory failure in setting of presumed COPD exacerbation: Patient reports his shortness of breath has improved. He notes he is still coughing but it has decreased. He has been afebrile and no leukocytosis. Symptoms seem most consistent with COPD exacerbation. - Continue Levaquin 750 mg daily (started 02/16/14) - Continue Prednisone 40 mg daily - Continue Duonebs Q4H - Continue Dulera - Continue guaifenesin-codeine for cough  Atypical CP: Patient reports CP has improved. He describes pain as right-sided and sometimes radiating towards the middle. Pain worse with cough or when taking a deep breath. He also has tenderness to palpation over the right chest. Most likely related to his COPD exacerbation. Troponins negative x 3. 2D Echo shows EF 65-70%, no wall motion abnormalities, grade I diastolic dysfunction. Very low suspicion for ACS. Lipid panel shows Chol 226, Trigly 204, HDL 60, and LDL 125. Patient has Atorvastatin 40 mg daily on his med rec, unclear if he is taking this medication.  - Continue Atorvastatin 40 mg daily - Continue ASA 81 mg daily - Continue treatment for COPDE as above - Morphine as needed  AKI- Resolved: Cr 1.18 on admission, baseline appears to be 0.9. Patient started on IVF 75 ml/hr for 12 hours. Cr improved this AM to 0.89.   Abdominal Pain: Patient reports abdominal pain is improving. Seems to be mostly epigastric and he has the most tenderness in this region on exam. LFTs normal. Lipase normal. No leukocytosis. Could possibly be chronic pancreatitis since he has history of alcohol abuse or related to his coughing from his COPD exacerbation or GERD.  - Consider imaging if pain worsens and doesn't improve with COPD exac improvement   Alcohol Abuse: Patient drinks 1-2 beers per day. Last drink 2 days ago. He has no evidence of alcohol  withdrawal on exam. Ethanol level normal.  - CIWA protocol - Folic acid/thiamine/multivitamin   ?Neuropathy: Possibly 2/2 diabetes or alcohol abuse. However, his HbA1c is 6.2 which makes diabetes less likely. 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  - Can be worked up as outpatient   HTN: BP 168/78 this morning. He is prescribed Lisinopril 10 mg daily and HCTZ 12.5 mg daily, but has not been taking these medications for several months.  - Continue to monitor BP - May need to restart BP meds gradually  Seizures: Hx of seizures but not on any medications at this time. Denies seizures related to alcohol.  - Continue seizure precautions  Diet: Regular VTE PPx: Lovenox SQ Dispo:  Disposition is deferred at this time, awaiting improvement of current medical problems.  Anticipated discharge in approximately 1-2 day(s).   The patient does not have a current PCP (No primary care provider on file.) and does need an [facility] hospital follow-up appointment after discharge.  The patient does not have transportation limitations that hinder transportation to clinic appointments.  .Services Needed at time of discharge: Y = Yes, Blank = No PT:   OT:   RN:   Equipment:   Other:     LOS: 1 day   Albin Felling, MD 02/17/2014, 12:08 PM

## 2014-02-18 ENCOUNTER — Observation Stay (HOSPITAL_COMMUNITY): Payer: Medicare Other

## 2014-02-18 ENCOUNTER — Encounter (HOSPITAL_COMMUNITY): Payer: Self-pay | Admitting: Radiology

## 2014-02-18 DIAGNOSIS — J9601 Acute respiratory failure with hypoxia: Secondary | ICD-10-CM | POA: Diagnosis not present

## 2014-02-18 DIAGNOSIS — R0789 Other chest pain: Secondary | ICD-10-CM | POA: Diagnosis not present

## 2014-02-18 DIAGNOSIS — I2510 Atherosclerotic heart disease of native coronary artery without angina pectoris: Secondary | ICD-10-CM

## 2014-02-18 DIAGNOSIS — J441 Chronic obstructive pulmonary disease with (acute) exacerbation: Secondary | ICD-10-CM | POA: Diagnosis not present

## 2014-02-18 DIAGNOSIS — K5730 Diverticulosis of large intestine without perforation or abscess without bleeding: Secondary | ICD-10-CM | POA: Diagnosis not present

## 2014-02-18 LAB — TROPONIN I: Troponin I: <0.03 ng/mL (ref <0.031)

## 2014-02-18 MED ORDER — POLYETHYLENE GLYCOL 3350 17 G PO PACK
17.0000 g | PACK | Freq: Every day | ORAL | Status: DC
Start: 2014-02-18 — End: 2014-02-19
  Administered 2014-02-18 – 2014-02-19 (×2): 17 g via ORAL
  Filled 2014-02-18 (×2): qty 1

## 2014-02-18 MED ORDER — IPRATROPIUM-ALBUTEROL 0.5-2.5 (3) MG/3ML IN SOLN
3.0000 mL | Freq: Three times a day (TID) | RESPIRATORY_TRACT | Status: DC
  Administered 2014-02-18 – 2014-02-19 (×2): 3 mL via RESPIRATORY_TRACT
  Filled 2014-02-18 (×3): qty 3

## 2014-02-18 MED ORDER — IOHEXOL 300 MG/ML  SOLN
25.0000 mL | INTRAMUSCULAR | Status: AC
  Administered 2014-02-18 (×2): 25 mL via ORAL

## 2014-02-18 MED ORDER — IOHEXOL 300 MG/ML  SOLN
100.0000 mL | Freq: Once | INTRAMUSCULAR | Status: AC | PRN
  Administered 2014-02-18: 100 mL via INTRAVENOUS

## 2014-02-18 NOTE — Progress Notes (Signed)
Dr. Trudee Kuster updated on pt status of pain. Pt rating pain 8/10 while appearing to be asleep upon entering room. Dr. Lenon Ahmadi ok for pt to go for CT of abd and ok to travel off tele.

## 2014-02-18 NOTE — Progress Notes (Signed)
UR completed 

## 2014-02-18 NOTE — Progress Notes (Signed)
Internal Medicine Attending  Date: 02/18/2014  Patient name: Gregory Pena Medical record number: [id number] Date of birth: 18-Jul-1955 Age: 58 y.o. Gender: male  I saw and evaluated the patient. I discussed patient and reviewed the resident's note by Dr. Arcelia Jew, and I agree with the resident's findings and plans as documented in her note.

## 2014-02-18 NOTE — Progress Notes (Signed)
Subjective: Overnight, patient had chest pain that responded to nitro and morphine. EKG was unremarkable and troponin negative x 2. This morning, patient reports chest pain is better but still 6/10 in severity, sharp, substernal, does not radiate, and worse with deep inspiration. He states his pain is similar to his pain on admission. He reports his abdominal pain is improving. He denies nausea, vomiting, and diaphoresis.  Objective: Vital signs in last 24 hours: Filed Vitals:   02/17/14 2202 02/18/14 0041 02/18/14 0512 02/18/14 0906  BP: 143/90 137/95 155/90   Pulse: 99 74    Temp:  97.6 F (36.4 C) 97.8 F (36.6 C)   TempSrc:  Oral Oral   Resp:  22 20   Height:      Weight:   75.297 kg (166 lb)   SpO2: 96% 100% 100% 98%   Weight change: 0.454 kg (1 lb)  Intake/Output Summary (Last 24 hours) at 02/18/14 1030 Last data filed at 02/18/14 0917  Gross per 24 hour  Intake    580 ml  Output   1125 ml  Net   -545 ml   Physical Exam General: resting in bed, NAD HEENT: [place]/AT, EOMI, sclera anicteric, mucus membranes moist CV: RRR, normal S1/S2, no m/g/r Pulm: wheezes bilaterally, breaths non-labored, on 2 L oxygen via [place] Abd: BS+, distended- worse than yesterday, tenderness to palpation in epigastrium Ext: warm, no edema, moves all Neuro: alert and oriented x 3, CNs II-XII intact  Lab Results: Basic Metabolic Panel:  Recent Labs Lab 02/16/14 0644 02/17/14 0259  NA 139 137  K 4.1 4.2  CL 105 108  CO2 22 19  GLUCOSE 100* 130*  BUN 17 14  CREATININE 1.18 0.89  CALCIUM 9.1 9.2   Liver Function Tests:  Recent Labs Lab 02/17/14 0259  AST 26  ALT 30  ALKPHOS 52  BILITOT 0.4  PROT 6.3  ALBUMIN 3.7    Recent Labs Lab 02/16/14 1521  LIPASE 38   CBC:  Recent Labs Lab 02/16/14 0644  WBC 9.2  HGB 15.8  HCT 46.9  MCV 95.5  PLT 318   Cardiac Enzymes:  Recent Labs Lab 02/16/14 2035 02/17/14 0259 02/18/14 0043  TROPONINI <0.03 <0.03 <0.03    Hemoglobin A1C:  Recent Labs Lab 02/16/14 1521  HGBA1C 6.2*   Fasting Lipid Panel:  Recent Labs Lab 02/16/14 1521  CHOL 226*  HDL 60  LDLCALC 125*  TRIG 204*  CHOLHDL 3.8   Urine Drug Screen: Drugs of Abuse     Component Value Date/Time   LABOPIA POSITIVE* 02/16/2014 1352   COCAINSCRNUR NONE DETECTED 02/16/2014 1352   LABBENZ NONE DETECTED 02/16/2014 1352   AMPHETMU NONE DETECTED 02/16/2014 1352   THCU NONE DETECTED 02/16/2014 1352   LABBARB NONE DETECTED 02/16/2014 1352    Alcohol Level:  Recent Labs Lab 02/16/14 1521  ETH <5    Studies/Results: Ct Abdomen Pelvis W Contrast  02/18/2014   CLINICAL DATA:  Epigastric pain and chest pain.  EXAM: CT ABDOMEN AND PELVIS WITH CONTRAST  TECHNIQUE: Multidetector CT imaging of the abdomen and pelvis was performed using the standard protocol following bolus administration of intravenous contrast.  CONTRAST:  139mL OMNIPAQUE IOHEXOL 300 MG/ML  SOLN  COMPARISON:  None.  FINDINGS: BODY WALL: Simple appearing lipoma within the upper tumor left gluteal musculature.  LOWER CHEST: Coronary atherosclerosis, likely with RCA stenting.  Mild scarring in the lingula and right middle lobe.  ABDOMEN/PELVIS:  Liver: No focal abnormality.  Biliary: No evidence  of biliary obstruction or stone.  Pancreas: Splenule contiguous with the tail. No inflammatory changes.  Spleen: Unremarkable.  Adrenals: Unremarkable.  Kidneys and ureters: No hydronephrosis or stone.  Bladder: Unremarkable.  Reproductive: Unremarkable.  Bowel: No obstruction. Diffuse, uncomplicated colonic diverticulosis. Negative appendix.  Retroperitoneum: No mass or adenopathy.  Peritoneum: No ascites or pneumoperitoneum.  Vascular: Aortic and branch vessel atherosclerosis, predominately calcified.  OSSEOUS: No acute abnormalities.  IMPRESSION: 1. No findings to explain acute abdominal pain. 2. Colonic diverticulosis.   Electronically Signed   By: Jorje Guild M.D.   On: 02/18/2014  04:16   Medications: I have reviewed the patient's current medications. Scheduled Meds: . aspirin EC  81 mg Oral Daily  . atorvastatin  40 mg Oral q1800  . enoxaparin (LOVENOX) injection  40 mg Subcutaneous Q24H  . folic acid  1 mg Oral Daily  . guaiFENesin-codeine  5 mL Oral Q6H  . Influenza vac split quadrivalent PF  0.5 mL Intramuscular Tomorrow-1000  . ipratropium-albuterol  3 mL Nebulization QID  . levofloxacin  750 mg Oral Daily  . mometasone-formoterol  2 puff Inhalation BID  . multivitamin with minerals  1 tablet Oral Daily  . pantoprazole  40 mg Oral Daily  . predniSONE  40 mg Oral Q breakfast  . thiamine  100 mg Oral Daily   Or  . thiamine  100 mg Intravenous Daily   Continuous Infusions:  PRN Meds:.albuterol, LORazepam **OR** LORazepam, morphine injection, nitroGLYCERIN Assessment/Plan: Principal Problem:   Acute exacerbation of chronic obstructive pulmonary disease (COPD) Active Problems:   CAD (coronary artery disease)   HTN (hypertension)   Atypical chest pain   Acute respiratory failure   Alcohol abuse   Tobacco abuse   Abdominal pain, epigastric   Abdominal pain Acute respiratory failure in setting of presumed COPD exacerbation: Patient reports his shortness of breath has improved. He notes he is still coughing but it has decreased. He has been afebrile and no leukocytosis.  - Continue Levaquin 750 mg daily (started 02/16/14) - Continue Prednisone 40 mg daily - Continue Duonebs Q4H - Continue Dulera - Continue guaifenesin-codeine for cough  Atypical CP: Patient had worsening chest pain last night. EKG unremarkable. Troponins negative x 2. He states his CP has improved this morning but still 6/10 and worse with deep inspiration. He does have significant cardiac history with CABG and multiple stents. Echo on 12/28 shows EF 65-70% and no wall motion abnormalities, grade I diastolic dysfunction. PE less likely with [place] score 0 and [place] 3.0. He was mildly  tachycardic yesterday, but not this morning. Will consult cardiology to determine if he will need stress testing.  - Cardiology consulted, appreciate recommendations  - Continue Atorvastatin 40 mg daily - Continue ASA 81 mg daily - Continue treatment for COPDE as above - Morphine and Nitro as needed  Abdominal Pain: Patient reports abdominal pain is improving. Seems to be mostly epigastric. LFTs normal. Lipase normal. No leukocytosis. CT Abd/Pelvis unremarkable. Could possibly be chronic pancreatitis since he has history of alcohol abuse or related to his coughing from his COPD exacerbation or GERD.  - Continue to monitor - Continue Protonix 40 mg daily  Alcohol Abuse: Patient drinks 1-2 beers per day. Last drink 2 days ago. He has no evidence of alcohol withdrawal on exam. Ethanol level normal.  - CIWA protocol - Folic acid/thiamine/multivitamin   ?Neuropathy: Possibly 2/2 diabetes or alcohol abuse. However, his HbA1c is 6.2 which makes diabetes less likely. He states the neuropathy in his  feet and fingers has been present for several years.  - Can be worked up as outpatient   HTN: BP 155/90 this morning. He is prescribed Lisinopril 10 mg daily and HCTZ 12.5 mg daily, but has not been taking these medications for several months.  - Continue to monitor BP - May need to restart BP meds gradually  Seizures: Hx of seizures but not on any medications at this time. Denies seizures related to alcohol.  - Continue seizure precautions  Diet: Regular VTE PPx: Lovenox SQ Dispo: Disposition is deferred at this time, awaiting improvement of current medical problems. Anticipated discharge in approximately 1-2 day(s).   The patient does not have a current PCP (No primary care provider on file.) and does need an [facility] hospital follow-up appointment after discharge.  The patient does not have transportation limitations that hinder transportation to clinic appointments.  .Services Needed at time  of discharge: Y = Yes, Blank = No PT:   OT:   RN:   Equipment:   Other:     LOS: 2 days   Albin Felling, MD 02/18/2014, 10:30 AM

## 2014-02-18 NOTE — Consult Note (Signed)
Patient ID: Gregory Pena MRN: [id number] DOB/AGE: [birth year] 58 y.o.  Admit date: 02/16/2014 Primary Cardiologist: New (He has seen a cardiologist in [place]) Reason for Consultation: Chest pain  HPI: 58 year old male with history of CAD s/p 1V CABG, HTN, tobacco abuse, COPD admitted with cough, SOB, right sided chest pain, epigastric pain. He uses inhalers daily for his COPD. History of CABG approximately 5 years ago in [facility] in [place]. He has been followed in [place] (Dr. Lissa Merlin) and was last seen in their office May 2013. He has a history of medication non-compliance. His cardiac history includes prior PCI on his RCA eventually requiring single vessel CABG followed by PCI and placement of a drug eluting stent in the SVG to RCA and the placement of a drug eluting stent in Diagonal branch of LAD (2.25 x 12 mm Promus DES).   He is now admitted to [facility] the teaching service with c/o SOB, cough, abdominal pain and chest pain. His chest pain is only with deep inspiration. He notes that this is not continuous or related to exertion. Not similar to his prior angina pain. Mild LE edema before admission. No exertional angina at home.  Troponin has been negative. Echo is pending. EKG without ischemic changes. CT abdomen and pelvis without acute findings this am.   Past Medical History  Diagnosis Date  . Hypertension   . Seizures   . Myocardial infarction   . Stroke   . CHF (congestive heart failure)   . COPD (chronic obstructive pulmonary disease)   . Coronary artery disease   . Shortness of breath dyspnea   . Anxiety   . GERD (gastroesophageal reflux disease)   . Coronary artery vasospasm   . Medical non-compliance     Family History  Problem Relation Age of Onset  . Coronary artery disease Neg Hx     History   Social History  . Marital Status: Single    Spouse Name: N/A    Number of Children: N/A  .  Years of Education: N/A   Occupational History  . Divorced    Social History Main Topics  . Smoking status: Current Every Day Smoker -- 0.50 packs/day for 30 years    Types: Cigarettes  . Smokeless tobacco: Former Systems developer     Comment: considering quitting  . Alcohol Use: Yes     Comment: 1-2 beers per day  . Drug Use: Yes    Special: Marijuana, Cocaine     Comment: hx of drug use, cocaine and marijuana  . Sexual Activity: Not on file   Other Topics Concern  . Not on file   Social History Narrative    Past Surgical History  Procedure Laterality Date  . Open heart surgery    . Coronary artery bypass graft    . Mouth surgery      jaw wiring     Allergies  Allergen Reactions  . Dilantin [Phenytoin] Other (See Comments)    Made him feel like he was going to die   . Vicodin [Hydrocodone-Acetaminophen] Hives    rash   Current meds:  . aspirin EC  81 mg Oral Daily  . atorvastatin  40 mg Oral q1800  . enoxaparin (LOVENOX) injection  40 mg Subcutaneous Q24H  . folic acid  1 mg Oral Daily  . guaiFENesin-codeine  5 mL Oral Q6H  . Influenza vac split quadrivalent PF  0.5 mL  Intramuscular Tomorrow-1000  . ipratropium-albuterol  3 mL Nebulization QID  . levofloxacin  750 mg Oral Daily  . mometasone-formoterol  2 puff Inhalation BID  . multivitamin with minerals  1 tablet Oral Daily  . pantoprazole  40 mg Oral Daily  . predniSONE  40 mg Oral Q breakfast  . thiamine  100 mg Oral Daily    Review of systems complete and found to be negative unless listed above   Physical Exam: Blood pressure 155/90, pulse 74, temperature 97.8 F (36.6 C), temperature source Oral, resp. rate 20, height 5\' 9"  (1.753 m), weight 166 lb (75.297 kg), SpO2 98 %.    General: Well developed, well nourished, NAD  HEENT: OP clear, mucus membranes moist  SKIN: warm, dry. No rashes.  Neuro: No focal deficits  Musculoskeletal: Muscle strength 5/5 all ext  Psychiatric: Mood and affect normal  Neck: No JVD,  no carotid bruits, no thyromegaly, no lymphadenopathy.  Lungs:Clear bilaterally, no wheezes, rhonci, crackles  Cardiovascular: Regular rate and rhythm. No murmurs, gallops or rubs.  Abdomen:Soft. Bowel sounds present. Non-tender.  Extremities: No lower extremity edema. Pulses are 2 + in the bilateral DP/PT.  Labs:   Lab Results  Component Value Date   WBC 9.2 02/16/2014   HGB 15.8 02/16/2014   HCT 46.9 02/16/2014   MCV 95.5 02/16/2014   PLT 318 02/16/2014     Recent Labs Lab 02/17/14 0259  NA 137  K 4.2  CL 108  CO2 19  BUN 14  CREATININE 0.89  CALCIUM 9.2  PROT 6.3  BILITOT 0.4  ALKPHOS 52  ALT 30  AST 26  GLUCOSE 130*   Lab Results  Component Value Date   TROPONINI <0.03 02/18/2014    CT abdomen and pelvis:  BODY WALL: Simple appearing lipoma within the upper tumor left gluteal musculature.  LOWER CHEST: Coronary atherosclerosis, likely with RCA stenting.  Mild scarring in the lingula and right middle lobe.  ABDOMEN/PELVIS:  Liver: No focal abnormality.  Biliary: No evidence of biliary obstruction or stone.  Pancreas: Splenule contiguous with the tail. No inflammatory changes.  Spleen: Unremarkable.  Adrenals: Unremarkable.  Kidneys and ureters: No hydronephrosis or stone.  Bladder: Unremarkable.  Reproductive: Unremarkable.  Bowel: No obstruction. Diffuse, uncomplicated colonic diverticulosis. Negative appendix.  Retroperitoneum: No mass or adenopathy.  Peritoneum: No ascites or pneumoperitoneum.  Vascular: Aortic and branch vessel atherosclerosis, predominately calcified.  OSSEOUS: No acute abnormalities.  IMPRESSION: 1. No findings to explain acute abdominal pain. 2. Colonic diverticulosis.  EKG: sinus, no ischemic changes  ASSESSMENT AND PLAN:   1. Chest pain, atypical with pleuritic features. Not like his typical angina 2. CAD s/p CABG and PCI of SVG to RCA and native Diagonal 3. Ongoing tobacco abuse 4.  COPD with possible exacerbation 5. Etoh use  He is known to have obstructive CAD but his current chest pain is not like his typical angina. Troponin negative. No ischemic EKG changes. With no objective evidence of ischemia and atypical features of pain, would favor stress testing. Will make NPO at midnight and plan stress myoview with Lexiscan in am. We will follow with you.    Signed: Lauree Chandler, MD 02/18/2014, 11:45 AM

## 2014-02-19 ENCOUNTER — Observation Stay (HOSPITAL_COMMUNITY): Payer: Medicare Other

## 2014-02-19 DIAGNOSIS — R0789 Other chest pain: Secondary | ICD-10-CM | POA: Diagnosis not present

## 2014-02-19 DIAGNOSIS — I10 Essential (primary) hypertension: Secondary | ICD-10-CM | POA: Diagnosis not present

## 2014-02-19 DIAGNOSIS — I2510 Atherosclerotic heart disease of native coronary artery without angina pectoris: Secondary | ICD-10-CM | POA: Diagnosis not present

## 2014-02-19 DIAGNOSIS — J9601 Acute respiratory failure with hypoxia: Secondary | ICD-10-CM | POA: Diagnosis not present

## 2014-02-19 DIAGNOSIS — E785 Hyperlipidemia, unspecified: Secondary | ICD-10-CM

## 2014-02-19 DIAGNOSIS — R079 Chest pain, unspecified: Secondary | ICD-10-CM

## 2014-02-19 MED ORDER — ATORVASTATIN CALCIUM 40 MG PO TABS: 40.0000 mg | ORAL_TABLET | Freq: Every day | ORAL | Status: DC

## 2014-02-19 MED ORDER — PREDNISONE 20 MG PO TABS: 40.0000 mg | ORAL_TABLET | Freq: Every day | ORAL | Status: DC

## 2014-02-19 MED ORDER — TECHNETIUM TC 99M SESTAMIBI GENERIC - CARDIOLITE
30.0000 | Freq: Once | INTRAVENOUS | Status: AC | PRN
  Administered 2014-02-19: 30 via INTRAVENOUS

## 2014-02-19 MED ORDER — HYDROCHLOROTHIAZIDE 25 MG PO TABS
12.5000 mg | ORAL_TABLET | Freq: Every day | ORAL | Status: DC
  Filled 2014-02-19: qty 0.5

## 2014-02-19 MED ORDER — TECHNETIUM TC 99M SESTAMIBI GENERIC - CARDIOLITE
10.0000 | Freq: Once | INTRAVENOUS | Status: AC | PRN
  Administered 2014-02-19: 10 via INTRAVENOUS

## 2014-02-19 MED ORDER — LEVOFLOXACIN 750 MG PO TABS
750.0000 mg | ORAL_TABLET | Freq: Every day | ORAL | Status: DC
Start: 2014-02-19 — End: 2014-03-02

## 2014-02-19 MED ORDER — PANTOPRAZOLE SODIUM 40 MG PO TBEC: 40.0000 mg | DELAYED_RELEASE_TABLET | Freq: Every day | ORAL | Status: DC

## 2014-02-19 MED ORDER — REGADENOSON 0.4 MG/5ML IV SOLN
0.4000 mg | Freq: Once | INTRAVENOUS | Status: AC
  Administered 2014-02-19: 0.4 mg via INTRAVENOUS

## 2014-02-19 MED ORDER — GUAIFENESIN-CODEINE 100-10 MG/5ML PO SOLN: 5.0000 mL | Freq: Four times a day (QID) | ORAL | Status: DC

## 2014-02-19 MED ORDER — HYDROCHLOROTHIAZIDE 12.5 MG PO CAPS
12.5000 mg | ORAL_CAPSULE | Freq: Every day | ORAL | Status: DC
  Filled 2014-02-19: qty 1

## 2014-02-19 MED ORDER — LISINOPRIL 10 MG PO TABS: 10.0000 mg | ORAL_TABLET | Freq: Every day | ORAL | Status: DC

## 2014-02-19 MED ORDER — ALBUTEROL SULFATE HFA 108 (90 BASE) MCG/ACT IN AERS: 2.0000 | INHALATION_SPRAY | Freq: Four times a day (QID) | RESPIRATORY_TRACT | Status: DC | PRN

## 2014-02-19 MED ORDER — ASPIRIN 81 MG PO TBEC: 81.0000 mg | DELAYED_RELEASE_TABLET | Freq: Every day | ORAL | Status: AC

## 2014-02-19 MED ORDER — HYDROCHLOROTHIAZIDE 25 MG PO TABS: 25.0000 mg | ORAL_TABLET | Freq: Every day | ORAL | Status: DC

## 2014-02-19 MED ORDER — SENNOSIDES-DOCUSATE SODIUM 8.6-50 MG PO TABS
1.0000 | ORAL_TABLET | Freq: Two times a day (BID) | ORAL | Status: DC
  Administered 2014-02-19: 1 via ORAL

## 2014-02-19 MED ORDER — FLUTICASONE-SALMETEROL 250-50 MCG/DOSE IN AEPB: 1.0000 | INHALATION_SPRAY | Freq: Two times a day (BID) | RESPIRATORY_TRACT | Status: DC

## 2014-02-19 MED ORDER — HYDROCHLOROTHIAZIDE 25 MG PO TABS
25.0000 mg | ORAL_TABLET | Freq: Every day | ORAL | Status: DC
  Administered 2014-02-19: 25 mg via ORAL
  Filled 2014-02-19: qty 1

## 2014-02-19 MED ORDER — REGADENOSON 0.4 MG/5ML IV SOLN
INTRAVENOUS | Status: AC
  Administered 2014-02-19: 0.08 mg via INTRAVENOUS
  Filled 2014-02-19: qty 5

## 2014-02-19 MED ORDER — HYDROCODONE-ACETAMINOPHEN 5-325 MG PO TABS: 1.0000 | ORAL_TABLET | Freq: Four times a day (QID) | ORAL | Status: DC | PRN

## 2014-02-19 MED ORDER — ADULT MULTIVITAMIN W/MINERALS CH: 1.0000 | ORAL_TABLET | Freq: Every day | ORAL | Status: DC

## 2014-02-19 NOTE — Discharge Instructions (Signed)
It was a pleasure taking care of you, Gregory Pena.  - Dennis Bast will be called by the Internal Medicine clinic to schedule a hospital follow up appointment and to be established in our clinic - Cardiology will also be calling to schedule a follow up appointment - Please take your Protonix every day - I have given you 1 month refills for all of your medications. You need to make your internal medicine clinic appointment so you can get refills  Take care and Happy New Year!  Dr Arcelia Jew

## 2014-02-19 NOTE — Progress Notes (Signed)
UR completed 

## 2014-02-19 NOTE — Progress Notes (Signed)
Myoview low risk. I will arrange cardiac follow up as an OP. He is on ASA and statin, not on beta blocker secondary to acute bronchitis.   Kerin Ransom PA-C 02/19/2014 3:09 PM

## 2014-02-19 NOTE — Progress Notes (Signed)
Subjective: Patient had Myoview this morning. He states both his chest pain and abdominal pain have improved since yesterday. He reports he has not had a bowel movement in 4 days and thinks is related to his abdominal pain and nausea.  Objective: Vital signs in last 24 hours: Filed Vitals:   02/19/14 0859 02/19/14 0910 02/19/14 0912 02/19/14 0914  BP: 146/104 159/95 161/92 157/94  Pulse:      Temp:      TempSrc:      Resp:      Height:      Weight:    74.118 kg (163 lb 6.4 oz)  SpO2:       Weight change:   Intake/Output Summary (Last 24 hours) at 02/19/14 1141 Last data filed at 02/19/14 4431  Gross per 24 hour  Intake    240 ml  Output   2475 ml  Net  -2235 ml   Physical Exam General: resting in bed, NAD HEENT: [place]/AT, EOMI, sclera anicteric, mucus membranes moist CV: RRR, normal S1/S2, no m/g/r Pulm: CTA bilaterally, no wheezing, breaths non-labored, on 2 L oxygen via [place] Abd: BS+, soft, mildly distended but not tense, non-tender Ext: warm, no edema, moves all Neuro: alert and oriented x 3, CNs II-XII intact  Lab Results: Basic Metabolic Panel:  Recent Labs Lab 02/16/14 0644 02/17/14 0259  NA 139 137  K 4.1 4.2  CL 105 108  CO2 22 19  GLUCOSE 100* 130*  BUN 17 14  CREATININE 1.18 0.89  CALCIUM 9.1 9.2   Liver Function Tests:  Recent Labs Lab 02/17/14 0259  AST 26  ALT 30  ALKPHOS 52  BILITOT 0.4  PROT 6.3  ALBUMIN 3.7    Recent Labs Lab 02/16/14 1521  LIPASE 38   CBC:  Recent Labs Lab 02/16/14 0644  WBC 9.2  HGB 15.8  HCT 46.9  MCV 95.5  PLT 318   Cardiac Enzymes:  Recent Labs Lab 02/16/14 2035 02/17/14 0259 02/18/14 0043  TROPONINI <0.03 <0.03 <0.03   Hemoglobin A1C:  Recent Labs Lab 02/16/14 1521  HGBA1C 6.2*   Fasting Lipid Panel:  Recent Labs Lab 02/16/14 1521  CHOL 226*  HDL 60  LDLCALC 125*  TRIG 204*  CHOLHDL 3.8   Urine Drug Screen: Drugs of Abuse     Component Value Date/Time   LABOPIA POSITIVE*  02/16/2014 1352   COCAINSCRNUR NONE DETECTED 02/16/2014 1352   LABBENZ NONE DETECTED 02/16/2014 1352   AMPHETMU NONE DETECTED 02/16/2014 1352   THCU NONE DETECTED 02/16/2014 1352   LABBARB NONE DETECTED 02/16/2014 1352    Alcohol Level:  Recent Labs Lab 02/16/14 1521  ETH <5   Studies/Results: Ct Abdomen Pelvis W Contrast  02/18/2014   CLINICAL DATA:  Epigastric pain and chest pain.  EXAM: CT ABDOMEN AND PELVIS WITH CONTRAST  TECHNIQUE: Multidetector CT imaging of the abdomen and pelvis was performed using the standard protocol following bolus administration of intravenous contrast.  CONTRAST:  131mL OMNIPAQUE IOHEXOL 300 MG/ML  SOLN  COMPARISON:  None.  FINDINGS: BODY WALL: Simple appearing lipoma within the upper tumor left gluteal musculature.  LOWER CHEST: Coronary atherosclerosis, likely with RCA stenting.  Mild scarring in the lingula and right middle lobe.  ABDOMEN/PELVIS:  Liver: No focal abnormality.  Biliary: No evidence of biliary obstruction or stone.  Pancreas: Splenule contiguous with the tail. No inflammatory changes.  Spleen: Unremarkable.  Adrenals: Unremarkable.  Kidneys and ureters: No hydronephrosis or stone.  Bladder: Unremarkable.  Reproductive: Unremarkable.  Bowel: No obstruction. Diffuse, uncomplicated colonic diverticulosis. Negative appendix.  Retroperitoneum: No mass or adenopathy.  Peritoneum: No ascites or pneumoperitoneum.  Vascular: Aortic and branch vessel atherosclerosis, predominately calcified.  OSSEOUS: No acute abnormalities.  IMPRESSION: 1. No findings to explain acute abdominal pain. 2. Colonic diverticulosis.   Electronically Signed   By: Jorje Guild M.D.   On: 02/18/2014 04:16   Medications: I have reviewed the patient's current medications. Scheduled Meds: . aspirin EC  81 mg Oral Daily  . atorvastatin  40 mg Oral q1800  . enoxaparin (LOVENOX) injection  40 mg Subcutaneous Q24H  . folic acid  1 mg Oral Daily  . guaiFENesin-codeine  5 mL Oral Q6H   . Influenza vac split quadrivalent PF  0.5 mL Intramuscular Tomorrow-1000  . ipratropium-albuterol  3 mL Nebulization TID  . levofloxacin  750 mg Oral Daily  . mometasone-formoterol  2 puff Inhalation BID  . multivitamin with minerals  1 tablet Oral Daily  . pantoprazole  40 mg Oral Daily  . polyethylene glycol  17 g Oral Daily  . predniSONE  40 mg Oral Q breakfast  . thiamine  100 mg Oral Daily   Continuous Infusions:  PRN Meds:.albuterol, LORazepam **OR** LORazepam, morphine injection, nitroGLYCERIN Assessment/Plan: Principal Problem:   Atypical chest pain-pleuritic chest pain Active Problems:   Acute exacerbation of chronic obstructive pulmonary disease (COPD)   CAD- s/p CABG ? 5 yrs ago   HTN (hypertension)   Acute respiratory failure   Alcohol abuse   Tobacco abuse-"quit two days ago"   Abdominal pain, epigastric   Dyslipidemia-untreated Acute respiratory failure in setting of presumed COPD exacerbation: Patient reports his shortness of breath has improved. He notes he is still coughing but it has decreased. He no longer has any wheezing on exam. He has been afebrile and no leukocytosis.  - Continue Levaquin 750 mg daily (started 02/16/14) - Continue Prednisone 40 mg daily - Continue Duonebs Q4H - Continue Dulera - Continue guaifenesin-codeine for cough  Atypical CP: Patient had Myoview this morning. He still notes mild chest pain, but seems to be improving overall.  - Cardiology following, appreciate recommendations  - f/u Myoview report - Continue Atorvastatin 40 mg daily - Continue ASA 81 mg daily - Continue treatment for COPDE as above - Morphine and Nitro as needed  Abdominal Pain: Patient reports abdominal pain continues to improve. Seems to be mostly epigastric. He states he has not had a BM in 4 days, could possibly be causing some of pain and distension. LFTs normal. Lipase normal. No leukocytosis. CT Abd/Pelvis unremarkable. Could possibly be chronic  pancreatitis since he has history of alcohol abuse or related to his coughing from his COPD exacerbation or GERD.  - Continue to monitor - Continue Protonix 40 mg daily - Continue Miralax  - Start senna-docusate  Alcohol Abuse: Patient drinks 1-2 beers per day. Last drink 2 days ago. He has no evidence of alcohol withdrawal on exam. Ethanol level normal.  - CIWA protocol - Folic acid/thiamine/multivitamin   ?Neuropathy: Possibly 2/2 diabetes or alcohol abuse. However, his HbA1c is 6.2 which makes diabetes less likely. 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  - Can be worked up as outpatient   HTN: BP 157/94 this morning. He is prescribed Lisinopril 10 mg daily and HCTZ 12.5 mg daily, but has not been taking these medications for several months.  - Continue to monitor BP - Restart HCTZ 12.5 mg daily. Can  restart Lisinopril if BP continues to be elevated  Seizures: Hx of seizures but not on any medications at this time. Denies seizures related to alcohol.  - Continue seizure precautions  Diet: Carb modified VTE PPx: Lovenox SQ Dispo: Disposition is deferred at this time, awaiting improvement of current medical problems. Anticipated discharge in approximately 1-2 day(s).   Diet: Regular VTE PPx: Lovenox SQ Dispo: Disposition is deferred at this time, awaiting improvement of current medical problems. Anticipated discharge today or tomorrow.   The patient does not have a current PCP (No primary care provider on file.) and does need an [facility] hospital follow-up appointment after discharge.  The patient does not have transportation limitations that hinder transportation to clinic appointments.  .Services Needed at time of discharge: Y = Yes, Blank = No PT:   OT:   RN:   Equipment:   Other:     LOS: 3 days   Albin Felling, MD 02/19/2014, 11:41 AM

## 2014-02-19 NOTE — Discharge Summary (Signed)
Name: Gregory Pena MRN: [id number] DOB: [birth year] 57 y.o. PCP: No primary care provider on file.  Date of Admission: 02/16/2014  6:29 AM Date of Discharge: 02/19/2014 Attending Physician: Axel Filler, MD  Discharge Diagnosis: 1.  Principal Problem: Acute exacerbation of chronic obstructive pulmonary disease (COPD) Atypical chest pain-pleuritic chest pain Active Problems:   CAD- s/p CABG ? 5 yrs ago   HTN (hypertension)   Acute respiratory failure   Tobacco abuse-"quit two days ago"   Abdominal pain, epigastric   Dyslipidemia-untreated  Discharge Medications:   Medication List    STOP taking these medications        azithromycin 250 MG tablet  Commonly known as:  ZITHROMAX Z-PAK      TAKE these medications        acetaminophen 500 MG tablet  Commonly known as:  TYLENOL  Take 1 tablet (500 mg total) by mouth every 6 (six) hours as needed.     albuterol 108 (90 BASE) MCG/ACT inhaler  Commonly known as:  PROVENTIL HFA;VENTOLIN HFA  Inhale 2 puffs into the lungs every 6 (six) hours as needed for wheezing or shortness of breath.     aspirin 81 MG EC tablet  Take 1 tablet (81 mg total) by mouth daily.     atorvastatin 40 MG tablet  Commonly known as:  LIPITOR  Take 1 tablet (40 mg total) by mouth daily at 6 PM.     CENTRUM ADULTS PO  Take 1 tablet by mouth daily.     COUGH DROPS 10 MG Lozg  Generic drug:  Menthol  Use as directed 1 lozenge in the mouth or throat as needed (for cough).     Fluticasone-Salmeterol 250-50 MCG/DOSE Aepb  Commonly known as:  ADVAIR  Inhale 1 puff into the lungs 2 (two) times daily.     guaiFENesin-codeine 100-10 MG/5ML syrup  Take 5 mLs by mouth every 6 (six) hours.     hydrochlorothiazide 25 MG tablet  Commonly known as:  HYDRODIURIL  Take 12.5 mg by mouth daily.     hydrochlorothiazide 25 MG tablet  Commonly known as:  HYDRODIURIL  Take 1 tablet (25 mg total) by mouth daily.     HYDROcodone-acetaminophen 5-325 MG  per tablet  Commonly known as:  NORCO/VICODIN  Take 1 tablet by mouth every 6 (six) hours as needed for moderate pain.     levofloxacin 750 MG tablet  Commonly known as:  LEVAQUIN  Take 1 tablet (750 mg total) by mouth daily.     lisinopril 10 MG tablet  Commonly known as:  PRINIVIL,ZESTRIL  Take 1 tablet (10 mg total) by mouth daily.     multivitamin with minerals Tabs tablet  Take 1 tablet by mouth daily.     nitroGLYCERIN 0.4 MG SL tablet  Commonly known as:  NITROSTAT  Place 0.4 mg under the tongue every 5 (five) minutes as needed for chest pain.     pantoprazole 40 MG tablet  Commonly known as:  PROTONIX  Take 1 tablet (40 mg total) by mouth daily.     predniSONE 20 MG tablet  Commonly known as:  DELTASONE  Take 2 tablets (40 mg total) by mouth daily with breakfast.     VITAMIN B 12 PO  Take 1 tablet by mouth daily.        Disposition and follow-up:   [name] was discharged from [facility] in Good condition.  At the hospital follow up visit please address:  1.  Atypcal Chest Pain: Is patient still experiencing chest pain? He will be contacted by cardiology for an outpatient follow up visit. Has this appointment been made? Abdominal Pain: Is patient still experiencing abdominal pain?  ?Neuropathy: Likely alcohol related, but warrants outpatient work up.  HTN: BP mildly elevated in 150-160s even after restarting home meds, Lisinopril 10 and HCTZ 12.5. Reassess BP and determine if medications need to be adjusted.  Establish Care: preventative care   2.  Labs / imaging needed at time of follow-up: Vitamin B12 level   3.  Pending labs/ test needing follow-up: None  Follow-up Appointments: Follow-up Information    Follow up with Lauree Chandler, MD.   Specialty:  Cardiology   Why:  office will call you   Contact information:   [place]. [address] [place] [address] [phone]       Discharge Instructions: Discharge  Instructions    Diet - low sodium heart healthy    Complete by:  As directed      Increase activity slowly    Complete by:  As directed            Consultations:    Procedures Performed:  Ct Abdomen Pelvis W Contrast  02/18/2014   CLINICAL DATA:  Epigastric pain and chest pain.  EXAM: CT ABDOMEN AND PELVIS WITH CONTRAST  TECHNIQUE: Multidetector CT imaging of the abdomen and pelvis was performed using the standard protocol following bolus administration of intravenous contrast.  CONTRAST:  160mL OMNIPAQUE IOHEXOL 300 MG/ML  SOLN  COMPARISON:  None.  FINDINGS: BODY WALL: Simple appearing lipoma within the upper tumor left gluteal musculature.  LOWER CHEST: Coronary atherosclerosis, likely with RCA stenting.  Mild scarring in the lingula and right middle lobe.  ABDOMEN/PELVIS:  Liver: No focal abnormality.  Biliary: No evidence of biliary obstruction or stone.  Pancreas: Splenule contiguous with the tail. No inflammatory changes.  Spleen: Unremarkable.  Adrenals: Unremarkable.  Kidneys and ureters: No hydronephrosis or stone.  Bladder: Unremarkable.  Reproductive: Unremarkable.  Bowel: No obstruction. Diffuse, uncomplicated colonic diverticulosis. Negative appendix.  Retroperitoneum: No mass or adenopathy.  Peritoneum: No ascites or pneumoperitoneum.  Vascular: Aortic and branch vessel atherosclerosis, predominately calcified.  OSSEOUS: No acute abnormalities.  IMPRESSION: 1. No findings to explain acute abdominal pain. 2. Colonic diverticulosis.   Electronically Signed   By: Jorje Guild M.D.   On: 02/18/2014 04:16   Nm Myocar Multi W/spect W/wall Motion / Ef  02/19/2014   CLINICAL DATA:  Mr. Gregory Pena is a 58 year old gentleman admitted to the hospital with episodes of chest pain. He was scheduled for a [place] study for further evaluation.  EXAM: MYOCARDIAL IMAGING WITH SPECT (REST AND EXERCISE)  GATED LEFT VENTRICULAR WALL MOTION STUDY  LEFT VENTRICULAR EJECTION FRACTION  TECHNIQUE:  Standard myocardial SPECT imaging was performed after resting intravenous injection of 10 mCi Tc-44m sestamibi. Subsequently, exercise tolerance test was performed by the patient under the supervision of the Cardiology staff. At peak-stress, 30 mCi Tc-61m sestamibi was injected intravenously and standard myocardial SPECT imaging was performed. Quantitative gated imaging was also performed to evaluate left ventricular wall motion, and estimate left ventricular ejection fraction.  COMPARISON:  None.  FINDINGS: There were no ST or T wave changes to suggest ischemia.  Raw images: There is minimal motion artifact. There is some diaphragmatic attenuation .  Stress images: There is a medium sized area of mild attenuation of the inferior wall.  Rest images: There is a medium sized  area of mild attenuation of the inferior wall.  There is no evidence of ischemia. There is a mild fixed defect that is likely due to diaphragmatic attenuation.  QGS analysis reveals:  End diastolic volume = 72 ml  End  systolic volume equals 29 mL  Ejection fraction = 60%  IMPRESSION: 1. Low risk Lexiscan Myoview study. There is a mild fixed defect in the inferior wall that likely represents diaphragmatic attenuation. There is no evidence of ischemia. This area the heart contracts normally which suggest that this area of attenuation is not due to infarction  2. Normal left ventricle systolic function with ejection fraction of 60%. There are no segmental wall motion abnormalities  Mertie Moores, MD, [facility]   Electronically Signed   By: Mertie Moores   On: 02/19/2014 14:25   Dg Chest Port 1 View  02/16/2014   CLINICAL DATA:  Shortness of breath for 4 days.  Chest pain.  EXAM: PORTABLE CHEST - 1 VIEW  COMPARISON:  11/19/2013  FINDINGS: Mild interstitial coarsening at the bases, suspect a smoking manifestation. There is no edema or convincing pneumonia. No effusion or pneumothorax.  Normal heart size and mediastinal contours. The patient is status  post CABG.  IMPRESSION: Stable exam.  No acute cardiopulmonary disease.   Electronically Signed   By: Jorje Guild M.D.   On: 02/16/2014 06:59   Myoview 02/19/14: IMPRESSION: 1. Low risk Lexiscan Myoview study. There is a mild fixed defect in the inferior wall that likely represents diaphragmatic attenuation. There is no evidence of ischemia. This area the heart contracts normally which suggest that this area of attenuation is not due to infarction  2. Normal left ventricle systolic function with ejection fraction of 60%. There are no segmental wall motion abnormalities   Admission HPI: Mr. Zee is a 58 year old smoking male with PMH of HTN, COPD, and CAD s/p CABG presenting to the ED today for complaints of worsening sob, productive cough, and right sided chest pain. He says he woke up around 5am this morning coughing with sharp right sided chest pain. He has been having worsening coughing spells with increased productive sputum (occasinally some red speckles--also when he blows his nose, clear, or yellow-green at times). The coughing is associated with SOB and chest pain/soreness. He claims he has been coughing more for the past 6 months but recently worse with sputum and SOB. He continues to smoke cigarettes, approximately 1 pack lasting 2 days with 30+ years of hx. Of note, he does recall having been told he has COPD and believes he had lung function testing done after MI but does not recall the results. Mr. Vise has been using his albuterol inhaler, twice yesterday, but has not been using his Advair.   In regards to his chest pain--mostly right sided, but occasionally on the left, worse with coughing, improves when coughing stops. Currently 6/10 and improving with morphine. Tender to Palpation. Similar prior episode in September with coughing and presumed COPD exacerbation as well. He admits to dyspnea on exertion, especially when riding his bike and sometimes has chest tightness  with sob at those times. Hx of CABG approximately 5 years ago, treated by Dr. Lissa Merlin from Cardiology at [facility] in [place]. He claims he last saw a Cardiologist last year and has not been compliant with this BP and cholesterol medication but plans to restart. He also does not have a current pcp and wishes to establish care soon. He was given names to offices  in the past but did not follow though due to insurance requirements and questions. Of note, he does claim to have heart failure but does not recall the specifics.   Finally, he also endorses occasional right finger numbness more than left and sometimes in his toes as well. He says the numbness occasionally moves to the left side. He denies hx of diabetes. He reports having heart burn controlled with protonix, and sometimes wakes up in a sweat.  Last alcohol use was 7pm last night with 1 beer, daily drinker of 1-2 beers that he says he has been drinking his whole life. Has not used cocaine for approximately 2 years and last use of marijuana approximately 6 months ago. Last BM this morning, denies diarrhea.   In the ED, he is noted to have drop in o2 sats to 86% on room air after continuous nebulizer treatment and occasionally dropped to 88% on room air during our conversation. O2 sats 92-96% on 2L Oxygen.   Similar ED visit September 2015 treated with zpack, short prednisone taper, and inhaler refills.   Hospital Course by problem list: Principal Problem: Acute exacerbation of chronic obstructive pulmonary disease (COPD) Atypical chest pain-pleuritic chest pain Active Problems:   CAD- s/p CABG ? 5 yrs ago   HTN (hypertension)   Acute respiratory failure   Tobacco abuse-"quit two days ago"   Abdominal pain, epigastric   Dyslipidemia-untreated   Acute Respiratory Failure in Setting of Presumed COPD Exacerbation: Patient presented with worsening SOB and productive cough in the setting of chronic smoking and COPD history. He  desaturated to 86% on room air and improved to 92-96% on 2 L oxygen via [place]. He had extensive wheezing on exam. Patient has Advair and Albuterol inhalers, but had not been compliant with these medications. His CXR with interstitial coarsening at bases but noted to have no edema and was not convincing of PNA (no fever or leukocytosis). He was started on Levaquin 750 mg daily (start date 02/16/14), Prednisone 40 mg daily, Duonebs, and guaifenesin-codeine for cough. His shortness of breath and wheezing continued to improve during his hospitalization without any complications. By time of discharge his symptoms had resolved and he no longer needed oxygen therapy (no desaturation upon ambulation). He was discharged on Levaquin 750 mg daily and Prednisone 40 mg daily to complete 5 days total of therapy. He will be called by the IM clinic for a hospital follow up appointment, as well as establish care, on Monday (02/23/14).   Atypical CP: Patient initially presented with right-sided chest pain that radiated towards the middle and was worse with coughing and deep inspiration. He also had mild tenderness to palpation over his right chest. Troponins were cycled and all 3 were negative. His chest pain was thought to be related to his COPD exacerbation. On the third day after admission, patient had worsening chest pain that responded to nitro and morphine. Troponins were cycled again and all 3 were negative. EKG was repeated and was unremarkable. Cardiology was consulted and recommended doing a Myoview scan given his history of CAD with multiple stents. The Myoview results showed low risk. The patient's chest pain continued to improve. He was discharged on ASA 81 mg daily, Atorvastatin 40 mg daily, Lisinopril 10 mg daily, and HCTZ 12.5 mg daily. He will have follow up with Cardiology (office to call for appointment) and the IM clinic.   AKI- Resolved: Cr elevated at 1.18 on admission with baseline ~ 0.9. Patient was started on  IVF @  75 ml/hr for 12 hours. The following morning his Cr improved to 0.89. His Cr remained at baseline for the remainder of his hospitalization.   Abdominal Pain: Patient presented with epigastric abdominal pain, which patient stated is chronic. His lipase and LFTs were normal. The pain started to improve, but did not completely resolve so a CT Abd/Pelvis was ordered. The CT was unremarkable for an acute process, only showing colonic diverticulosis. Patient's pain steadily continued to improve by time of discharge. Likely related to his chronic alcohol use vs. GERD. He was prescribed Protonix 40 mg daily upon discharge.   Alcohol Use: Patient reported drinking 1-2 beers daily. His last drink had been 1 day prior to admission. He was counseled on the adverse side effects of chronic alcohol use. Patient was placed on CIWA during his hospitalization and had no evidence of alcohol withdrawal. He was also given folic acid, thiamine, and a multivitamin.   ?Neuropathy: Patient reported stocking and glove pattern tingling/numbness in his fingers and toes, worse on right side. Likely a result of his diabetes or chronic alcohol use. His HbA1c was 6.2 which suggests his diabetes is well controlled. He could benefit from further work up as outpatient, including checking vitamin B12 level.   HTN: BP initially in 120/60s, but then continued to elevate to 562Z systolic. Patient had Lisinopril 10 mg daily and HCT 12.5 mg daily on his med rec but had not taken these medications for several months. He was restarted on these home medications and his BP improved to the 308-657Q systolic with the occasional spike in the 160s. He may need adjustment of his BP medications at his outpatient follow up visit in the IM clinic.    Seizures: Patient with history of seizures, but is currently not on any medications. He denies any recent seizure activity. He was placed on seizure precautions during his hospitalization and no seizures  were witnessed.   Discharge Vitals:   BP 148/84 mmHg  Pulse 94  Temp(Src) 97.5 F (36.4 C) (Oral)  Resp 18  Ht 5\' 9"  (1.753 m)  Wt 74.118 kg (163 lb 6.4 oz)  BMI 24.12 kg/m2  SpO2 97% Physical Exam General: resting in bed, NAD HEENT: [place]/AT, EOMI, sclera anicteric, mucus membranes moist CV: RRR, normal S1/S2, no m/g/r Pulm: CTA bilaterally, no wheezing, breaths non-labored, on 2 L oxygen via [place] Abd: BS+, soft, mildly distended but not tense, non-tender Ext: warm, no edema, moves all Neuro: alert and oriented x 3, CNs II-XII intact  Discharge Labs:  No results found for this or any previous visit (from the past 24 hour(s)).  Signed: Albin Felling, MD 02/19/2014, 4:23 PM    Services Ordered on Discharge: None Equipment Ordered on Discharge: None

## 2014-02-19 NOTE — Progress Notes (Signed)
Internal Medicine Attending  Date: 02/19/2014  Patient name: Gregory Pena Medical record number: [id number] Date of birth: 03/24/1955 Age: 57 y.o. Gender: male  I saw and evaluated the patient. I discussed patient and reviewed the resident's note by Dr. Arcelia Jew, and I agree with the resident's findings and plans as documented in her note.  Myoview done today was a low risk study; cardiology will arrange outpatient cardiac follow-up.  Patient will need outpatient general medicine follow-up as well.

## 2014-02-19 NOTE — Progress Notes (Signed)
    Subjective:  No chest pain this am  Objective:  Vital Signs in the last 24 hours: Temp:  [97.7 F (36.5 C)-98.4 F (36.9 C)] 98.1 F (36.7 C) (12/31 0651) Pulse Rate:  [80-105] 80 (12/31 0651) Resp:  [18] 18 (12/31 0651) BP: (138-161)/(76-104) 161/92 mmHg (12/31 0912) SpO2:  [94 %-100 %] 98 % (12/31 0651)  Intake/Output from previous day:  Intake/Output Summary (Last 24 hours) at 02/19/14 0914 Last data filed at 02/19/14 0413  Gross per 24 hour  Intake    480 ml  Output   2475 ml  Net  -1995 ml    Physical Exam: General appearance: alert, cooperative and no distress Lungs: expiratory wheezing Heart: regular rate and rhythm   Rate: 80  Rhythm: normal sinus rhythm  Lab Results: No results for input(s): WBC, HGB, PLT in the last 72 hours.  Recent Labs  02/17/14 0259  NA 137  K 4.2  CL 108  CO2 19  GLUCOSE 130*  BUN 14  CREATININE 0.89    Recent Labs  02/17/14 0259 02/18/14 0043  TROPONINI <0.03 <0.03     Cardiac Studies:  Assessment/Plan:   Principal Problem:   Atypical chest pain-pleuritic chest pain Active Problems:   Acute exacerbation of chronic obstructive pulmonary disease (COPD)   CAD- s/p CABG ? 5 yrs ago   Acute respiratory failure   HTN (hypertension)   Dyslipidemia-untreated   Alcohol abuse   Tobacco abuse-"quit two days ago"   Abdominal pain, epigastric   PLAN: Troponin negative- Myoview this am.  Gregory Ransom PA-C Beeper [phone] 02/19/2014, 9:14 AM   Personally seen and examined. Agree with above. Feels good, minimal pain.  Waiting for NUC results If low risk, OK to DC.  Treat BP/HTN COPD Gregory Furbish, MD

## 2014-03-02 ENCOUNTER — Encounter: Payer: Self-pay | Admitting: Internal Medicine

## 2014-03-02 ENCOUNTER — Ambulatory Visit (INDEPENDENT_AMBULATORY_CARE_PROVIDER_SITE_OTHER): Payer: Medicare Other | Admitting: Internal Medicine

## 2014-03-02 VITALS — BP 132/80 | HR 111 | Temp 97.3°F | Ht 69.0 in | Wt 172.1 lb

## 2014-03-02 DIAGNOSIS — I2510 Atherosclerotic heart disease of native coronary artery without angina pectoris: Secondary | ICD-10-CM

## 2014-03-02 DIAGNOSIS — G2581 Restless legs syndrome: Secondary | ICD-10-CM | POA: Insufficient documentation

## 2014-03-02 DIAGNOSIS — E785 Hyperlipidemia, unspecified: Secondary | ICD-10-CM

## 2014-03-02 DIAGNOSIS — I10 Essential (primary) hypertension: Secondary | ICD-10-CM

## 2014-03-02 DIAGNOSIS — R209 Unspecified disturbances of skin sensation: Secondary | ICD-10-CM | POA: Diagnosis not present

## 2014-03-02 DIAGNOSIS — I2583 Coronary atherosclerosis due to lipid rich plaque: Secondary | ICD-10-CM

## 2014-03-02 LAB — VITAMIN B12: Vitamin B-12: 776 pg/mL (ref 211–911)

## 2014-03-02 MED ORDER — LISINOPRIL 10 MG PO TABS: 10.0000 mg | ORAL_TABLET | Freq: Every day | ORAL | Status: DC

## 2014-03-02 MED ORDER — GABAPENTIN 300 MG PO CAPS: 300.0000 mg | ORAL_CAPSULE | Freq: Every day | ORAL | Status: DC

## 2014-03-02 MED ORDER — METOPROLOL TARTRATE 25 MG PO TABS: 25.0000 mg | ORAL_TABLET | Freq: Two times a day (BID) | ORAL | Status: DC

## 2014-03-02 MED ORDER — HYDROCHLOROTHIAZIDE 25 MG PO TABS
25.0000 mg | ORAL_TABLET | Freq: Every day | ORAL | Status: DC
Start: 2014-03-02 — End: 2015-02-24

## 2014-03-02 MED ORDER — ATORVASTATIN CALCIUM 40 MG PO TABS: 40.0000 mg | ORAL_TABLET | Freq: Every day | ORAL | Status: DC

## 2014-03-02 NOTE — Patient Instructions (Signed)
General Instructions: I want you to start taking Metoporolol 25mg  twice a day for your heart.  You may resume Neurotin 300mg  at bedtime.  Please bring your medicines with you each time you come to clinic.  Medicines may include prescription medications, over-the-counter medications, herbal remedies, eye drops, vitamins, or other pills.   Progress Toward Treatment Goals:  Treatment Goal 03/02/2014  Blood pressure at goal    Self Care Goals & Plans:  Self Care Goal 03/02/2014  Manage my medications take my medicines as prescribed; bring my medications to every visit; refill my medications on time; follow the sick day instructions if I am sick  Eat healthy foods eat more vegetables; eat fruit for snacks and desserts; eat foods that are low in salt; eat baked foods instead of fried foods; eat smaller portions; drink diet soda or water instead of juice or soda  Be physically active find an activity I enjoy    No flowsheet data found.   Care Management & Community Referrals:  Referral 03/02/2014  Referrals made for care management support none needed

## 2014-03-02 NOTE — Progress Notes (Signed)
[facility] Subjective:   Patient ID: Gregory Pena male   DOB: [birth year] 59 y.o.   MRN: [id number]  HPI: [name] is a 59 y.o. male with a PMH of HTN, CHF, COPD, CAD (s/p CABG in [place])GERD, seizures, and ETOH abuse who presents for a HFU and to establish with a PCP.  He was recently admitted to our service from 02/16/14-02/19/14 due to increasing SOB and chest pain.  He was diagnosed with a COPD exacerbation and treated.  He was evaluated by cardiology while inpatient and had a Myoview stress test which calculated a low risk.  He was instructed to follow up with cardiology ( has appointment 1/25). He has completed the course of prednisone and levaquin, he denies SOB.   He reports his chest pain and abdominal pain have completely resolved  He also reports a history of restless leg syndrome for which he notes he used to take Gabapentin.  He reports this keeps him up at night and also notes some burning and abnormal sensation on the dorsal aspect of his feet.  He denied any numbness in is hands (apparently reported this in the hospital).  He reports he has not had any alcohol since discharge. He also notes he has been working out at [employer] and is getting a Physiological scientist, his trainer would like to know what limitations he may have for exercise.  Past Medical History  Diagnosis Date  . Hypertension   . Seizures   . Myocardial infarction   . Stroke   . CHF (congestive heart failure)   . COPD (chronic obstructive pulmonary disease)   . Coronary artery disease   . Shortness of breath dyspnea   . Anxiety   . GERD (gastroesophageal reflux disease)   . Coronary artery vasospasm   . Medical non-compliance    Current Outpatient Prescriptions  Medication Sig Dispense Refill  . acetaminophen (TYLENOL) 500 MG tablet Take 1 tablet (500 mg total) by mouth every 6 (six) hours as needed. 30 tablet 0  . albuterol (PROVENTIL HFA;VENTOLIN HFA) 108 (90 BASE)  MCG/ACT inhaler Inhale 2 puffs into the lungs every 6 (six) hours as needed for wheezing or shortness of breath. 1 Inhaler 0  . aspirin EC 81 MG EC tablet Take 1 tablet (81 mg total) by mouth daily. 30 tablet 0  . atorvastatin (LIPITOR) 40 MG tablet Take 1 tablet (40 mg total) by mouth daily at 6 PM. 90 tablet 1  . Cyanocobalamin (VITAMIN B 12 PO) Take 1 tablet by mouth daily.    . Fluticasone-Salmeterol (ADVAIR) 250-50 MCG/DOSE AEPB Inhale 1 puff into the lungs 2 (two) times daily. 60 each 0  . gabapentin (NEURONTIN) 300 MG capsule Take 1 capsule (300 mg total) by mouth at bedtime. 90 capsule 1  . hydrochlorothiazide (HYDRODIURIL) 25 MG tablet Take 1 tablet (25 mg total) by mouth daily. 90 tablet 1  . lisinopril (PRINIVIL,ZESTRIL) 10 MG tablet Take 1 tablet (10 mg total) by mouth daily. 90 tablet 1  . Menthol (COUGH DROPS) 10 MG LOZG Use as directed 1 lozenge in the mouth or throat as needed (for cough).    . metoprolol tartrate (LOPRESSOR) 25 MG tablet Take 1 tablet (25 mg total) by mouth 2 (two) times daily. 60 tablet 1  . Multiple Vitamins-Minerals (CENTRUM ADULTS PO) Take 1 tablet by mouth daily.    . nitroGLYCERIN (NITROSTAT) 0.4 MG SL tablet Place 0.4 mg under the tongue every 5 (five) minutes as needed  for chest pain.    . pantoprazole (PROTONIX) 40 MG tablet Take 1 tablet (40 mg total) by mouth daily. 30 tablet 0   No current facility-administered medications for this visit.   Family History  Problem Relation Age of Onset  . Coronary artery disease Neg Hx    History   Social History  . Marital Status: Single    Spouse Name: N/A    Number of Children: N/A  . Years of Education: N/A   Occupational History  . Divorced    Social History Main Topics  . Smoking status: Former Smoker -- 0.50 packs/day for 30 years    Types: Cigarettes  . Smokeless tobacco: Former Systems developer     Comment: considering quitting  . Alcohol Use: 0.0 oz/week    0 Not specified per week     Comment: 1-2  beers per day  . Drug Use: Yes    Special: Marijuana, Cocaine     Comment: hx of drug use, cocaine and marijuana  . Sexual Activity: None   Other Topics Concern  . None   Social History Narrative   Review of Systems: Review of Systems  Constitutional: Negative for fever, chills and malaise/fatigue.  Eyes: Negative for blurred vision.  Respiratory: Negative for cough and shortness of breath.   Cardiovascular: Negative for chest pain.  Gastrointestinal: Negative for abdominal pain, diarrhea and constipation.  Genitourinary: Negative for dysuria.  Musculoskeletal: Negative for myalgias.  Neurological: Positive for tingling. Negative for headaches.  Psychiatric/Behavioral: Negative for substance abuse.     Objective:  Physical Exam: Filed Vitals:   03/02/14 1440  BP: 132/80  Pulse: 111  Temp: 97.3 F (36.3 C)  Height: 5\' 9"  (1.753 m)  Weight: 172 lb 1.6 oz (78.064 kg)  SpO2: 95%  Physical Exam  Constitutional: He is oriented to person, place, and time and well-developed, well-nourished, and in no distress.  HENT:  Head: Normocephalic and atraumatic.  Cardiovascular: Normal rate, regular rhythm and normal heart sounds.   Pulmonary/Chest: Effort normal and breath sounds normal.  Abdominal: Soft. Bowel sounds are normal.  Musculoskeletal: He exhibits no edema.  Neurological: He is alert and oriented to person, place, and time. He displays normal reflexes.  Normal sensation to light touch of bilateral dorsal and plantar aspect of bilateral feet.  Psychiatric: Affect normal.  Nursing note and vitals reviewed.   Assessment & Plan:  Case discussed with Dr. Lynnae January  HTN (hypertension) BP Readings from Last 3 Encounters:  03/02/14 132/80  02/19/14 148/84  11/19/13 124/78    Lab Results  Component Value Date   NA 137 02/17/2014   K 4.2 02/17/2014   CREATININE 0.89 02/17/2014    Assessment: Blood pressure control: controlled Progress toward BP goal:  at  goal Comments:   Plan: Medications:  HCTZ 25mg  daily, Lisinopril 10mg  daily Educational resources provided: brochure, handout, video Self management tools provided:   Other plans:     CAD- s/p CABG ? 5 yrs ago - Has follow up with Cardiology here in 2 weeks. - Refill Atorvastatin check lipid panel at next visit.   RLS (restless legs syndrome) - Limited records available at this time, patient reports history of RLS and apparently was treated with gabapentin. - Will check B12 level to ensure this is not the cause of his abnormal sensation of his feet. - WIll empirically try gabapentin and have patient follow up.   Dyslipidemia-untreated - Continue Atorvastatin - To early to check lipid panel.   Disturbance of  skin sensation - Check B12>>> wnl - Trial of gabapentin.     Medications Ordered Meds ordered this encounter  Medications  . atorvastatin (LIPITOR) 40 MG tablet    Sig: Take 1 tablet (40 mg total) by mouth daily at 6 PM.    Dispense:  90 tablet    Refill:  1  . hydrochlorothiazide (HYDRODIURIL) 25 MG tablet    Sig: Take 1 tablet (25 mg total) by mouth daily.    Dispense:  90 tablet    Refill:  1  . lisinopril (PRINIVIL,ZESTRIL) 10 MG tablet    Sig: Take 1 tablet (10 mg total) by mouth daily.    Dispense:  90 tablet    Refill:  1  . gabapentin (NEURONTIN) 300 MG capsule    Sig: Take 1 capsule (300 mg total) by mouth at bedtime.    Dispense:  90 capsule    Refill:  1  . metoprolol tartrate (LOPRESSOR) 25 MG tablet    Sig: Take 1 tablet (25 mg total) by mouth 2 (two) times daily.    Dispense:  60 tablet    Refill:  1   Other Orders Orders Placed This Encounter  Procedures  . Vitamin B12

## 2014-03-05 NOTE — Assessment & Plan Note (Signed)
BP Readings from Last 3 Encounters:  03/02/14 132/80  02/19/14 148/84  11/19/13 124/78    Lab Results  Component Value Date   NA 137 02/17/2014   K 4.2 02/17/2014   CREATININE 0.89 02/17/2014    Assessment: Blood pressure control: controlled Progress toward BP goal:  at goal Comments:   Plan: Medications:  HCTZ 25mg  daily, Lisinopril 10mg  daily Educational resources provided: brochure, handout, video Self management tools provided:   Other plans:

## 2014-03-05 NOTE — Assessment & Plan Note (Signed)
-   Continue Atorvastatin - To early to check lipid panel.

## 2014-03-05 NOTE — Assessment & Plan Note (Signed)
-   Has follow up with Cardiology here in 2 weeks. - Refill Atorvastatin check lipid panel at next visit.

## 2014-03-05 NOTE — Assessment & Plan Note (Signed)
-   Limited records available at this time, patient reports history of RLS and apparently was treated with gabapentin. - Will check B12 level to ensure this is not the cause of his abnormal sensation of his feet. - WIll empirically try gabapentin and have patient follow up.

## 2014-03-05 NOTE — Assessment & Plan Note (Signed)
-   Check B12>>> wnl - Trial of gabapentin.

## 2014-03-06 NOTE — Progress Notes (Signed)
Internal Medicine Clinic Attending  Case discussed with Dr. Hoffman soon after the resident saw the patient.  We reviewed the resident's history and exam and pertinent patient test results.  I agree with the assessment, diagnosis, and plan of care documented in the resident's note. 

## 2014-03-16 ENCOUNTER — Encounter: Payer: Medicare Other | Admitting: Physician Assistant

## 2014-03-30 ENCOUNTER — Encounter: Payer: Medicare Other | Admitting: Physician Assistant

## 2014-04-03 ENCOUNTER — Encounter: Payer: Medicare Other | Admitting: Physician Assistant

## 2014-04-13 ENCOUNTER — Ambulatory Visit: Payer: Medicare Other | Admitting: Physician Assistant

## 2014-11-21 DIAGNOSIS — J440 Chronic obstructive pulmonary disease with acute lower respiratory infection: Secondary | ICD-10-CM | POA: Diagnosis not present

## 2014-11-21 DIAGNOSIS — G40909 Epilepsy, unspecified, not intractable, without status epilepticus: Secondary | ICD-10-CM | POA: Diagnosis not present

## 2014-11-21 DIAGNOSIS — R55 Syncope and collapse: Secondary | ICD-10-CM | POA: Diagnosis not present

## 2014-11-21 DIAGNOSIS — J324 Chronic pansinusitis: Secondary | ICD-10-CM | POA: Diagnosis not present

## 2015-02-08 ENCOUNTER — Emergency Department (HOSPITAL_COMMUNITY)
Admission: EM | Admit: 2015-02-08 | Discharge: 2015-02-08 | Disposition: A | Discharge status: Home / Self Care | Payer: Medicare Other | Attending: Emergency Medicine | Admitting: Emergency Medicine

## 2015-02-08 ENCOUNTER — Encounter (HOSPITAL_COMMUNITY): Payer: Self-pay | Admitting: Emergency Medicine

## 2015-02-08 ENCOUNTER — Emergency Department (HOSPITAL_COMMUNITY): Payer: Medicare Other

## 2015-02-08 DIAGNOSIS — F419 Anxiety disorder, unspecified: Secondary | ICD-10-CM | POA: Diagnosis not present

## 2015-02-08 DIAGNOSIS — R05 Cough: Secondary | ICD-10-CM | POA: Diagnosis not present

## 2015-02-08 DIAGNOSIS — Z7951 Long term (current) use of inhaled steroids: Secondary | ICD-10-CM | POA: Diagnosis not present

## 2015-02-08 DIAGNOSIS — G40909 Epilepsy, unspecified, not intractable, without status epilepticus: Secondary | ICD-10-CM | POA: Diagnosis not present

## 2015-02-08 DIAGNOSIS — K219 Gastro-esophageal reflux disease without esophagitis: Secondary | ICD-10-CM | POA: Insufficient documentation

## 2015-02-08 DIAGNOSIS — Z7952 Long term (current) use of systemic steroids: Secondary | ICD-10-CM | POA: Diagnosis not present

## 2015-02-08 DIAGNOSIS — Z8673 Personal history of transient ischemic attack (TIA), and cerebral infarction without residual deficits: Secondary | ICD-10-CM | POA: Diagnosis not present

## 2015-02-08 DIAGNOSIS — Z792 Long term (current) use of antibiotics: Secondary | ICD-10-CM | POA: Diagnosis not present

## 2015-02-08 DIAGNOSIS — I2510 Atherosclerotic heart disease of native coronary artery without angina pectoris: Secondary | ICD-10-CM | POA: Insufficient documentation

## 2015-02-08 DIAGNOSIS — I252 Old myocardial infarction: Secondary | ICD-10-CM | POA: Diagnosis not present

## 2015-02-08 DIAGNOSIS — I509 Heart failure, unspecified: Secondary | ICD-10-CM | POA: Diagnosis not present

## 2015-02-08 DIAGNOSIS — R0602 Shortness of breath: Secondary | ICD-10-CM | POA: Diagnosis not present

## 2015-02-08 DIAGNOSIS — R069 Unspecified abnormalities of breathing: Secondary | ICD-10-CM | POA: Diagnosis not present

## 2015-02-08 DIAGNOSIS — Z951 Presence of aortocoronary bypass graft: Secondary | ICD-10-CM | POA: Diagnosis not present

## 2015-02-08 DIAGNOSIS — Z87891 Personal history of nicotine dependence: Secondary | ICD-10-CM | POA: Diagnosis not present

## 2015-02-08 DIAGNOSIS — Z79899 Other long term (current) drug therapy: Secondary | ICD-10-CM | POA: Insufficient documentation

## 2015-02-08 DIAGNOSIS — Z7982 Long term (current) use of aspirin: Secondary | ICD-10-CM | POA: Diagnosis not present

## 2015-02-08 DIAGNOSIS — J441 Chronic obstructive pulmonary disease with (acute) exacerbation: Secondary | ICD-10-CM | POA: Diagnosis not present

## 2015-02-08 DIAGNOSIS — I10 Essential (primary) hypertension: Secondary | ICD-10-CM | POA: Diagnosis not present

## 2015-02-08 DIAGNOSIS — Z9889 Other specified postprocedural states: Secondary | ICD-10-CM | POA: Insufficient documentation

## 2015-02-08 LAB — COMPREHENSIVE METABOLIC PANEL
ALT: 31 U/L (ref 17–63)
AST: 27 U/L (ref 15–41)
Albumin: 3.9 g/dL (ref 3.5–5.0)
Alkaline Phosphatase: 65 U/L (ref 38–126)
Anion gap: 11 (ref 5–15)
BUN: 11 mg/dL (ref 6–20)
CO2: 24 mmol/L (ref 22–32)
Calcium: 9.6 mg/dL (ref 8.9–10.3)
Chloride: 104 mmol/L (ref 101–111)
Creatinine, Ser: 0.9 mg/dL (ref 0.61–1.24)
GFR calc Af Amer: >60 mL/min (ref >60)
GFR calc non Af Amer: >60 mL/min (ref >60)
Glucose, Bld: 118 mg/dL — ABNORMAL HIGH (ref 65–99)
Potassium: 4.4 mmol/L (ref 3.5–5.1)
Sodium: 139 mmol/L (ref 135–145)
Total Bilirubin: 0.4 mg/dL (ref 0.3–1.2)
Total Protein: 6.5 g/dL (ref 6.5–8.1)

## 2015-02-08 LAB — CBC WITH DIFFERENTIAL/PLATELET
Basophils Absolute: 0 K/uL (ref 0.0–0.1)
Basophils Relative: 0 %
Eosinophils Absolute: 0.2 K/uL (ref 0.0–0.7)
Eosinophils Relative: 2 %
HCT: 45 % (ref 39.0–52.0)
Hemoglobin: 14.5 g/dL (ref 13.0–17.0)
Lymphocytes Relative: 40 %
Lymphs Abs: 3.4 K/uL (ref 0.7–4.0)
MCH: 31.5 pg (ref 26.0–34.0)
MCHC: 32.2 g/dL (ref 30.0–36.0)
MCV: 97.8 fL (ref 78.0–100.0)
Monocytes Absolute: 0.6 K/uL (ref 0.1–1.0)
Monocytes Relative: 7 %
Neutro Abs: 4.4 K/uL (ref 1.7–7.7)
Neutrophils Relative %: 51 %
Platelets: 297 K/uL (ref 150–400)
RBC: 4.6 MIL/uL (ref 4.22–5.81)
RDW: 14.5 % (ref 11.5–15.5)
WBC: 8.5 K/uL (ref 4.0–10.5)

## 2015-02-08 LAB — TROPONIN I
Troponin I: <0.03 ng/mL
Troponin I: <0.03 ng/mL (ref <0.031)

## 2015-02-08 MED ORDER — ALBUTEROL SULFATE HFA 108 (90 BASE) MCG/ACT IN AERS
2.0000 | INHALATION_SPRAY | Freq: Once | RESPIRATORY_TRACT | Status: AC
  Administered 2015-02-08: 2 via RESPIRATORY_TRACT
  Filled 2015-02-08: qty 6.7

## 2015-02-08 MED ORDER — MAGNESIUM SULFATE 2 GM/50ML IV SOLN
2.0000 g | Freq: Once | INTRAVENOUS | Status: AC
  Administered 2015-02-08: 2 g via INTRAVENOUS
  Filled 2015-02-08: qty 50

## 2015-02-08 MED ORDER — AZITHROMYCIN 250 MG PO TABS: ORAL_TABLET | ORAL | Status: DC

## 2015-02-08 MED ORDER — PREDNISONE 20 MG PO TABS: ORAL_TABLET | ORAL | Status: DC

## 2015-02-08 MED ORDER — SALINE SPRAY 0.65 % NA SOLN
1.0000 | NASAL | Status: DC | PRN
  Administered 2015-02-08: 1 via NASAL
  Filled 2015-02-08: qty 44

## 2015-02-08 MED ORDER — IPRATROPIUM-ALBUTEROL 0.5-2.5 (3) MG/3ML IN SOLN
3.0000 mL | Freq: Once | RESPIRATORY_TRACT | Status: AC
  Administered 2015-02-08: 3 mL via RESPIRATORY_TRACT
  Filled 2015-02-08: qty 3

## 2015-02-08 MED ORDER — IPRATROPIUM-ALBUTEROL 0.5-2.5 (3) MG/3ML IN SOLN
3.0000 mL | RESPIRATORY_TRACT | Status: DC
  Administered 2015-02-08: 3 mL via RESPIRATORY_TRACT
  Filled 2015-02-08: qty 3

## 2015-02-08 MED ORDER — IPRATROPIUM-ALBUTEROL 0.5-2.5 (3) MG/3ML IN SOLN: 3.0000 mL | RESPIRATORY_TRACT | Status: DC

## 2015-02-08 MED ORDER — SODIUM CHLORIDE 0.9 % IV BOLUS (SEPSIS)
1000.0000 mL | Freq: Once | INTRAVENOUS | Status: AC
  Administered 2015-02-08: 1000 mL via INTRAVENOUS

## 2015-02-08 NOTE — ED Notes (Signed)
Per EMS pt woke up around 3:30 coughing and unable to "catch his breath".  He attempted to use his inhaler however it did not relieve the breathing issues.  Pt reports that no major illnesses. Hx of COPD, cardiac stints, HNT and seizures.  EMS administered a dul-neb and 125 of sol-med which has seemed to assist him in breathing.

## 2015-02-08 NOTE — ED Notes (Signed)
Pt ambulated to Pod C and back to rm- O2 sats on arrival back to room 95%.

## 2015-02-08 NOTE — ED Provider Notes (Signed)
Second troponin is negative. Per prior plan, will discharge patient has a COPD exacerbation.  Sherwood Gambler, MD 02/08/15 [phone]

## 2015-02-08 NOTE — ED Provider Notes (Signed)
CSN: [id number]     Arrival date & time 02/08/15  [id number] History   First MD Initiated Contact with Patient 02/08/15 0440     Chief Complaint  Patient presents with  . Shortness of Breath     (Consider location/radiation/quality/duration/timing/severity/associated sxs/prior Treatment) Patient is a 59 y.o. male presenting with shortness of breath.  Shortness of Breath Severity:  Mild Onset quality:  Gradual Duration:  2 days Timing:  Constant Progression:  Worsening Chronicity:  Recurrent Context: not activity and not emotional upset   Relieved by:  None tried Worsened by:  Nothing tried Ineffective treatments:  None tried Associated symptoms: cough   Associated symptoms: no chest pain, no fever, no headaches, no neck pain, no rash and no vomiting     Past Medical History  Diagnosis Date  . Hypertension   . Seizures ([place])   . Myocardial infarction ([place])   . Stroke ([place])   . CHF (congestive heart failure) ([place])   . COPD (chronic obstructive pulmonary disease) ([place])   . Coronary artery disease   . Shortness of breath dyspnea   . Anxiety   . GERD (gastroesophageal reflux disease)   . Coronary artery vasospasm ([place])   . Medical non-compliance    Past Surgical History  Procedure Laterality Date  . Open heart surgery    . Coronary artery bypass graft    . Mouth surgery      jaw wiring    Family History  Problem Relation Age of Onset  . Coronary artery disease Neg Hx    Social History  Substance Use Topics  . Smoking status: Former Smoker -- 0.50 packs/day for 30 years    Types: Cigarettes  . Smokeless tobacco: Former Systems developer     Comment: considering quitting  . Alcohol Use: 0.0 oz/week    0 Standard drinks or equivalent per week     Comment: 1-2 beers per day    Review of Systems  Constitutional: Negative for fever, chills and fatigue.  Eyes: Negative for pain and redness.  Respiratory: Positive for cough, chest tightness (one short 'about 2 seconds' episode) and  shortness of breath.   Cardiovascular: Negative for chest pain.  Gastrointestinal: Negative for nausea and vomiting.  Musculoskeletal: Negative for myalgias, back pain and neck pain.  Skin: Negative for pallor and rash.  Neurological: Negative for headaches.  All other systems reviewed and are negative.     Allergies  Dilantin and Vicodin  Home Medications   Prior to Admission medications   Medication Sig Start Date End Date Taking? Authorizing Provider  acetaminophen (TYLENOL) 500 MG tablet Take 1 tablet (500 mg total) by mouth every 6 (six) hours as needed. 11/19/13  Yes Charlesetta Shanks, MD  albuterol (PROVENTIL HFA;VENTOLIN HFA) 108 (90 BASE) MCG/ACT inhaler Inhale 2 puffs into the lungs every 6 (six) hours as needed for wheezing or shortness of breath. 02/19/14  Yes Juliet Rude, MD  aspirin EC 81 MG EC tablet Take 1 tablet (81 mg total) by mouth daily. 02/19/14  Yes Juliet Rude, MD  atorvastatin (LIPITOR) 40 MG tablet Take 1 tablet (40 mg total) by mouth daily at 6 PM. 03/02/14  Yes Lucious Groves, DO  Cyanocobalamin (VITAMIN B 12 PO) Take 1 tablet by mouth daily.   Yes Historical Provider, MD  Fluticasone-Salmeterol (ADVAIR) 250-50 MCG/DOSE AEPB Inhale 1 puff into the lungs 2 (two) times daily. 02/19/14  Yes Carly Montey Hora, MD  gabapentin (NEURONTIN) 300 MG capsule Take 1 capsule (300  mg total) by mouth at bedtime. 03/02/14 03/02/15 Yes Lucious Groves, DO  hydrochlorothiazide (HYDRODIURIL) 25 MG tablet Take 1 tablet (25 mg total) by mouth daily. 03/02/14  Yes Lucious Groves, DO  lisinopril (PRINIVIL,ZESTRIL) 10 MG tablet Take 1 tablet (10 mg total) by mouth daily. 03/02/14  Yes Lucious Groves, DO  Menthol (COUGH DROPS) 10 MG LOZG Use as directed 1 lozenge in the mouth or throat as needed (for cough).   Yes Historical Provider, MD  metoprolol tartrate (LOPRESSOR) 25 MG tablet Take 1 tablet (25 mg total) by mouth 2 (two) times daily. 03/02/14 03/02/15 Yes Lucious Groves, DO  Multiple  Vitamins-Minerals (CENTRUM ADULTS PO) Take 1 tablet by mouth daily.   Yes Historical Provider, MD  nitroGLYCERIN (NITROSTAT) 0.4 MG SL tablet Place 0.4 mg under the tongue every 5 (five) minutes as needed for chest pain.   Yes Historical Provider, MD  pantoprazole (PROTONIX) 40 MG tablet Take 1 tablet (40 mg total) by mouth daily. 02/19/14  Yes Carly Montey Hora, MD  azithromycin (ZITHROMAX Z-PAK) 250 MG tablet 2 po day one, then 1 daily x 4 days 02/09/15   Merrily Pew, MD  predniSONE (DELTASONE) 20 MG tablet 3 tabs po day one, then 2 po daily x 4 days 02/09/15   Merrily Pew, MD   BP 117/74 mmHg  Pulse 83  Temp(Src) 97.8 F (36.6 C) (Oral)  Resp 16  Ht 5\' 9"  (1.753 m)  Wt 173 lb (78.472 kg)  BMI 25.54 kg/m2  SpO2 96% Physical Exam  Constitutional: He appears well-developed and well-nourished. No distress.  HENT:  Head: Normocephalic and atraumatic.  Eyes: Conjunctivae are normal. Pupils are equal, round, and reactive to light.  Neck: Normal range of motion.  Cardiovascular: Normal rate.   Pulmonary/Chest: Effort normal. Tachypnea noted. No apnea and no bradypnea. No respiratory distress. He has decreased breath sounds. He has wheezes. He has no rhonchi. He has no rales.  Abdominal: Soft. He exhibits no distension. There is no tenderness.  Musculoskeletal: Normal range of motion. He exhibits no edema.  Neurological: He is alert.  Skin: Skin is warm and dry. He is not diaphoretic.  Nursing note and vitals reviewed.   ED Course  Procedures (including critical care time) Labs Review Labs Reviewed  COMPREHENSIVE METABOLIC PANEL - Abnormal; Notable for the following:    Glucose, Bld 118 (*)    All other components within normal limits  CBC WITH DIFFERENTIAL/PLATELET  TROPONIN I  TROPONIN I    Imaging Review Dg Chest 2 View  02/08/2015  CLINICAL DATA:  Evaluate for pneumonia. Shortness of breath, chest pain, cough. EXAM: CHEST  2 VIEW COMPARISON:  02/16/2014 FINDINGS: Stable mild  scarring in the right middle lobe and lingula. There is no edema, consolidation, effusion, or pneumothorax. Normal heart size and mediastinal contours post CABG and coronary stenting. IMPRESSION: Stable exam.  No evidence of acute disease. Electronically Signed   By: Monte Fantasia M.D.   On: 02/08/2015 05:55   I have personally reviewed and evaluated these images and lab results as part of my medical decision-making.   EKG Interpretation   Date/Time:  Monday February 08 2015 04:48:07 EST Ventricular Rate:  83 PR Interval:  144 QRS Duration: 93 QT Interval:  376 QTC Calculation: 442 R Axis:   64 Text Interpretation:  Sinus rhythm Consider left atrial enlargement  Borderline T wave abnormalities No significant change since last tracing  Confirmed by [facility] MD, Corene Cornea [phone]) on 02/08/2015 4:52:21  AM      MDM   Final diagnoses:  COPD exacerbation ([place])    Likely copd exacerbation. Improved with duo nebs and steroids. Started on abx, steroid burst at home. Doubt ACS, CP was fleeting and associated with cough, delta troponins negative. Doubt PE with significant improvement on COPD txs.     Merrily Pew, MD 02/09/15 [phone]

## 2015-02-08 NOTE — ED Notes (Signed)
Patient transported to X-ray 

## 2015-02-24 ENCOUNTER — Ambulatory Visit (INDEPENDENT_AMBULATORY_CARE_PROVIDER_SITE_OTHER): Payer: Medicare Other | Admitting: Internal Medicine

## 2015-02-24 ENCOUNTER — Encounter: Payer: Self-pay | Admitting: Internal Medicine

## 2015-02-24 ENCOUNTER — Telehealth: Payer: Self-pay | Admitting: Internal Medicine

## 2015-02-24 VITALS — BP 141/94 | HR 101 | Temp 98.0°F | Ht 69.0 in | Wt 172.0 lb

## 2015-02-24 DIAGNOSIS — I2510 Atherosclerotic heart disease of native coronary artery without angina pectoris: Secondary | ICD-10-CM | POA: Diagnosis not present

## 2015-02-24 DIAGNOSIS — J449 Chronic obstructive pulmonary disease, unspecified: Secondary | ICD-10-CM

## 2015-02-24 DIAGNOSIS — E785 Hyperlipidemia, unspecified: Secondary | ICD-10-CM

## 2015-02-24 DIAGNOSIS — Z0000 Encounter for general adult medical examination without abnormal findings: Secondary | ICD-10-CM

## 2015-02-24 DIAGNOSIS — I2583 Coronary atherosclerosis due to lipid rich plaque: Secondary | ICD-10-CM

## 2015-02-24 DIAGNOSIS — I5030 Unspecified diastolic (congestive) heart failure: Secondary | ICD-10-CM | POA: Diagnosis not present

## 2015-02-24 DIAGNOSIS — G2581 Restless legs syndrome: Secondary | ICD-10-CM | POA: Diagnosis not present

## 2015-02-24 DIAGNOSIS — K219 Gastro-esophageal reflux disease without esophagitis: Secondary | ICD-10-CM

## 2015-02-24 DIAGNOSIS — I10 Essential (primary) hypertension: Secondary | ICD-10-CM | POA: Diagnosis not present

## 2015-02-24 LAB — BRAIN NATRIURETIC PEPTIDE: B NATRIURETIC PEPTIDE 5: 13.9 pg/mL (ref 0.0–100.0)

## 2015-02-24 MED ORDER — ALBUTEROL SULFATE (2.5 MG/3ML) 0.083% IN NEBU: 2.5000 mg | INHALATION_SOLUTION | Freq: Four times a day (QID) | RESPIRATORY_TRACT | Status: AC | PRN

## 2015-02-24 MED ORDER — MOMETASONE FURO-FORMOTEROL FUM 100-5 MCG/ACT IN AERO
2.0000 | INHALATION_SPRAY | Freq: Two times a day (BID) | RESPIRATORY_TRACT | Status: DC
Start: 2015-02-24 — End: 2015-02-24

## 2015-02-24 MED ORDER — PANTOPRAZOLE SODIUM 40 MG PO TBEC: 40.0000 mg | DELAYED_RELEASE_TABLET | Freq: Every day | ORAL | Status: DC

## 2015-02-24 MED ORDER — GABAPENTIN 300 MG PO CAPS: 300.0000 mg | ORAL_CAPSULE | Freq: Three times a day (TID) | ORAL | Status: DC

## 2015-02-24 MED ORDER — TIOTROPIUM BROMIDE MONOHYDRATE 18 MCG IN CAPS: 18.0000 ug | ORAL_CAPSULE | Freq: Every day | RESPIRATORY_TRACT | Status: DC

## 2015-02-24 MED ORDER — METOPROLOL TARTRATE 25 MG PO TABS: 25.0000 mg | ORAL_TABLET | Freq: Two times a day (BID) | ORAL | Status: DC

## 2015-02-24 MED ORDER — NITROGLYCERIN 0.4 MG SL SUBL: 0.4000 mg | SUBLINGUAL_TABLET | SUBLINGUAL | Status: AC | PRN

## 2015-02-24 MED ORDER — ATORVASTATIN CALCIUM 40 MG PO TABS: 40.0000 mg | ORAL_TABLET | Freq: Every day | ORAL | Status: AC

## 2015-02-24 MED ORDER — ALBUTEROL SULFATE HFA 108 (90 BASE) MCG/ACT IN AERS
2.0000 | INHALATION_SPRAY | Freq: Four times a day (QID) | RESPIRATORY_TRACT | Status: DC | PRN
Start: 2015-02-24 — End: 2015-05-07

## 2015-02-24 MED ORDER — LISINOPRIL 10 MG PO TABS: 10.0000 mg | ORAL_TABLET | Freq: Every day | ORAL | Status: DC

## 2015-02-24 NOTE — Progress Notes (Addendum)
Patient ID: Gregory Pena, male   DOB: 09-Aug-1955, 60 y.o.   MRN: [id number]    Subjective:   Patient ID: Gregory Pena male   DOB: 1955/08/10 60 y.o.   MRN: [id number]  HPI: Gregory Pena is a 60 y.o. male with PMH as below, here for ED follow up of presumed COPD exacerbation.  Please see Problem-Based charting for the status of the patient's chronic medical issues.     Past Medical History  Diagnosis Date  . Hypertension   . Seizures ([place])   . Myocardial infarction ([place])   . Stroke ([place])   . CHF (congestive heart failure) ([place])   . COPD (chronic obstructive pulmonary disease) ([place])   . Coronary artery disease   . Shortness of breath dyspnea   . Anxiety   . GERD (gastroesophageal reflux disease)   . 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 Medical non-compliance    Current Outpatient Prescriptions  Medication Sig Dispense Refill  . acetaminophen (TYLENOL) 500 MG tablet Take 1 tablet (500 mg total) by mouth every 6 (six) hours as needed. 30 tablet 0  . albuterol (PROVENTIL HFA;VENTOLIN HFA) 108 (90 BASE) MCG/ACT inhaler Inhale 2 puffs into the lungs every 6 (six) hours as needed for wheezing or shortness of breath. 1 Inhaler 0  . aspirin EC 81 MG EC tablet Take 1 tablet (81 mg total) by mouth daily. 30 tablet 0  . atorvastatin (LIPITOR) 40 MG tablet Take 1 tablet (40 mg total) by mouth daily at 6 PM. 90 tablet 1  . azithromycin (ZITHROMAX Z-PAK) 250 MG tablet 2 po day one, then 1 daily x 4 days 5 tablet 0  . Cyanocobalamin (VITAMIN B 12 PO) Take 1 tablet by mouth daily.    . Fluticasone-Salmeterol (ADVAIR) 250-50 MCG/DOSE AEPB Inhale 1 puff into the lungs 2 (two) times daily. 60 each 0  . gabapentin (NEURONTIN) 300 MG capsule Take 1 capsule (300 mg total) by mouth at bedtime. 90 capsule 1  . hydrochlorothiazide (HYDRODIURIL) 25 MG tablet Take 1 tablet (25 mg total) by mouth daily. 90 tablet 1  . lisinopril (PRINIVIL,ZESTRIL) 10 MG tablet Take 1 tablet (10 mg total) by mouth daily. 90  tablet 1  . Menthol (COUGH DROPS) 10 MG LOZG Use as directed 1 lozenge in the mouth or throat as needed (for cough).    . metoprolol tartrate (LOPRESSOR) 25 MG tablet Take 1 tablet (25 mg total) by mouth 2 (two) times daily. 60 tablet 1  . Multiple Vitamins-Minerals (CENTRUM ADULTS PO) Take 1 tablet by mouth daily.    . nitroGLYCERIN (NITROSTAT) 0.4 MG SL tablet Place 0.4 mg under the tongue every 5 (five) minutes as needed for chest pain.    . pantoprazole (PROTONIX) 40 MG tablet Take 1 tablet (40 mg total) by mouth daily. 30 tablet 0  . predniSONE (DELTASONE) 20 MG tablet 3 tabs po day one, then 2 po daily x 4 days 11 tablet 0   No current facility-administered medications for this visit.   Family History  Problem Relation Age of Onset  . Coronary artery disease Neg Hx    Social History   Social History  . Marital Status: Single    Spouse Name: N/A  . Number of Children: N/A  . Years of Education: N/A   Occupational History  . Divorced    Social History Main Topics  . Smoking status: Light Tobacco Smoker -- 0.50 packs/day for 30 years    Types: Cigarettes  . Smokeless tobacco:  Former Systems developer     Comment: considering quitting / [place]  . Alcohol Use: 0.0 oz/week    0 Standard drinks or equivalent per week     Comment: 1-2 beers per day  . Drug Use: Yes    Special: Marijuana, Cocaine     Comment: hx of drug use, cocaine and marijuana  . Sexual Activity: Not Asked   Other Topics Concern  . None   Social History Narrative   Review of Systems: A comprehensive review of systems was negative except for: SOB, cough productive of white sputum, orthopnea, intermittent leg swelling, 20 lb weight gain over last 2-3 months, and one episode of blood in his stool. Objective:  Physical Exam: Filed Vitals:   02/24/15 1441  BP: 141/94  Pulse: 101  Temp: 98 F (36.7 C)  TempSrc: Oral  Height: 5\' 9"  (1.753 m)  Weight: 172 lb (78.019 kg)  SpO2: 95%   Physical Exam    Constitutional: He is oriented to person, place, and time and well-developed, well-nourished, and in no distress. No distress.  HENT:  Head: Normocephalic and atraumatic.  Eyes: EOM are normal. No scleral icterus.  Neck: No JVD present. No tracheal deviation present.  Cardiovascular: Normal rate, regular rhythm, normal heart sounds and intact distal pulses.   Pulmonary/Chest: Effort normal and breath sounds normal. No stridor. No respiratory distress. He has no wheezes.  Abdominal: Soft. He exhibits no distension. There is no tenderness. There is no rebound and no guarding.  Musculoskeletal: He exhibits no edema.  Neurological: He is alert and oriented to person, place, and time.  Skin: Skin is dry. No rash noted. He is not diaphoretic.     Assessment & Plan:   Patient and case were discussed with Dr. Lynnae January.  Please refer to Problem Based charting for further documentation.

## 2015-02-24 NOTE — Patient Instructions (Addendum)
1. Stop smoking. 2. Use Spiriva inhaler once daily, regardless of symptoms. 3. Return to clinic in 1 month.  Chronic Obstructive Pulmonary Disease Chronic obstructive pulmonary disease (COPD) is a common lung condition in which airflow from the lungs is limited. COPD is a general term that can be used to describe many different lung problems that limit airflow, including both chronic bronchitis and emphysema. If you have COPD, your lung function will probably never return to normal, but there are measures you can take to improve lung function and make yourself feel better. CAUSES   Smoking (common).  Exposure to secondhand smoke.  Genetic problems.  Chronic inflammatory lung diseases or recurrent infections. SYMPTOMS  Shortness of breath, especially with physical activity.  Deep, persistent (chronic) cough with a large amount of thick mucus.  Wheezing.  Rapid breaths (tachypnea).  Gray or bluish discoloration (cyanosis) of the skin, especially in your fingers, toes, or lips.  Fatigue.  Weight loss.  Frequent infections or episodes when breathing symptoms become much worse (exacerbations).  Chest tightness. DIAGNOSIS Your health care provider will take a medical history and perform a physical examination to diagnose COPD. Additional tests for COPD may include:  Lung (pulmonary) function tests.  Chest X-ray.  CT scan.  Blood tests. TREATMENT  Treatment for COPD may include:  Inhaler and nebulizer medicines. These help manage the symptoms of COPD and make your breathing more comfortable.  Supplemental oxygen. Supplemental oxygen is only helpful if you have a low oxygen level in your blood.  Exercise and physical activity. These are beneficial for nearly all people with COPD.  Lung surgery or transplant.  Nutrition therapy to gain weight, if you are underweight.  Pulmonary rehabilitation. This may involve working with a team of health care providers and  specialists, such as respiratory, occupational, and physical therapists. HOME CARE INSTRUCTIONS  Take all medicines (inhaled or pills) as directed by your health care provider.  Avoid over-the-counter medicines or cough syrups that dry up your airway (such as antihistamines) and slow down the elimination of secretions unless instructed otherwise by your health care provider.  If you are a smoker, the most important thing that you can do is stop smoking. Continuing to smoke will cause further lung damage and breathing trouble. Ask your health care provider for help with quitting smoking. He or she can direct you to community resources or hospitals that provide support.  Avoid exposure to irritants such as smoke, chemicals, and fumes that aggravate your breathing.  Use oxygen therapy and pulmonary rehabilitation if directed by your health care provider. If you require home oxygen therapy, ask your health care provider whether you should purchase a pulse oximeter to measure your oxygen level at home.  Avoid contact with individuals who have a contagious illness.  Avoid extreme temperature and humidity changes.  Eat healthy foods. Eating smaller, more frequent meals and resting before meals may help you maintain your strength.  Stay active, but balance activity with periods of rest. Exercise and physical activity will help you maintain your ability to do things you want to do.  Preventing infection and hospitalization is very important when you have COPD. Make sure to receive all the vaccines your health care provider recommends, especially the pneumococcal and influenza vaccines. Ask your health care provider whether you need a pneumonia vaccine.  Learn and use relaxation techniques to manage stress.  Learn and use controlled breathing techniques as directed by your health care provider. Controlled breathing techniques include:  Pursed lip  breathing. Start by breathing in (inhaling) through  your nose for 1 second. Then, purse your lips as if you were going to whistle and breathe out (exhale) through the pursed lips for 2 seconds.  Diaphragmatic breathing. Start by putting one hand on your abdomen just above your waist. Inhale slowly through your nose. The hand on your abdomen should move out. Then purse your lips and exhale slowly. You should be able to feel the hand on your abdomen moving in as you exhale.  Learn and use controlled coughing to clear mucus from your lungs. Controlled coughing is a series of short, progressive coughs. The steps of controlled coughing are: 1. Lean your head slightly forward. 2. Breathe in deeply using diaphragmatic breathing. 3. Try to hold your breath for 3 seconds. 4. Keep your mouth slightly open while coughing twice. 5. Spit any mucus out into a tissue. 6. Rest and repeat the steps once or twice as needed. SEEK MEDICAL CARE IF:  You are coughing up more mucus than usual.  There is a change in the color or thickness of your mucus.  Your breathing is more labored than usual.  Your breathing is faster than usual. SEEK IMMEDIATE MEDICAL CARE IF:  You have shortness of breath while you are resting.  You have shortness of breath that prevents you from:  Being able to talk.  Performing your usual physical activities.  You have chest pain lasting longer than 5 minutes.  Your skin color is more cyanotic than usual.  You measure low oxygen saturations for longer than 5 minutes with a pulse oximeter. MAKE SURE YOU:  Understand these instructions.  Will watch your condition.  Will get help right away if you are not doing well or get worse.   This information is not intended to replace advice given to you by your health care provider. Make sure you discuss any questions you have with your health care provider.   Document Released: 11/16/2004 Document Revised: 02/27/2014 Document Reviewed: 10/03/2012 Elsevier Interactive Patient  Education [employer].

## 2015-02-24 NOTE — Telephone Encounter (Signed)
Order has been faxed to [facility] for his nebulizer machine.  If patient has any questions please have him to contact [facility] @ [phone]

## 2015-02-25 NOTE — Progress Notes (Signed)
Internal Medicine Clinic Attending  Case discussed with Dr. Taylor at the time of the visit.  We reviewed the resident's history and exam and pertinent patient test results.  I agree with the assessment, diagnosis, and plan of care documented in the resident's note. 

## 2015-02-25 NOTE — Assessment & Plan Note (Addendum)
Patient seen in ED for dyspnea and presumed COPD exacerbation.  He was given a Zpack and Prednisone burst, which he has since completed.  He continues to complain of mild SOB and cough productive of white sputum.  Prior to the acute exacerbation, he was using his Albuterol inhaler 2-3x/day and had 2-3 nighttime awakenings per day.  He has not been using his Advair inhaler.  He thinks it has been 5 years since he had PFTs.  He is asking for a nebulizer machine at home.  A/P: Uncontrolled COPD, likely now Gold Stage 2.  Symptoms uncontrolled on Albuterol alone, so will add LAMA.  He has not been using his Advair, so will discontinue that for now.  Patient would likely benefit from home nebulizer to prevent need to go to ED for nebulizer treatments. - Nebulizer - Albuterol refills - Spiriva QD - Order PFTs at follow up visit

## 2015-02-25 NOTE — Assessment & Plan Note (Signed)
Patient has never had a colonoscopy and reports 1 episode of blood in his stool 2-3 weeks ago.  He denies black or tarry stools.  He denies lightheadedness or dizziness.  Recent Hgb in ED was normal.  A/P: Hematochezia. Patient will need colonoscopy ordered for evaluation.  Will address at next visit. - Colonoscopy referral at next visit.

## 2015-02-25 NOTE — Assessment & Plan Note (Signed)
Patient reports past diagnosis of RLS, worse at night, unable to sit still, and preventing sleep.  He states he also gets similar pains when walking, which he states is consistent with his RLS.  It has previously been treated with Gabapentin.  A/P: RLS. Concern for claudication given his past CAD history.  Will refill Gabapentin and assess need for ABI at next visit. - Consider ABI at next visit.

## 2015-02-25 NOTE — Assessment & Plan Note (Addendum)
Patient complaining of right sided chest pain, described as pins/needles, sometimes radiating to right fingertips.  Pain only lasts a couple seconds.  It is worsened with cough and relieved with ASA.  He endorses some SOB when walking half a block, but denies chest pain a/w those episodes.  He has had about 20 lb weight gain over the last 2-3 months and endorses orthopnea and intermittent leg swelling.  He had an Echo in 2015 showing grade 1 diastolic dysfunction.  Stress test in 2015 was also negative.   A/P: No crackles on exam or leg swelling.  There is concern for worsening HFpEF given his weight gain and orthopnea, but BNP normal (14).  Will refill all patient's medications.  Low concern for ACS given negative cath.  Most likely diagnosis is MSK pain 2/2 cough and recent COPD exacerbation.  GERD may be playing a role, as he has not been taking his PPI.  Will hold HCTZ, since he has not been taking it and his BP is borderline controlled at this time.  Patient to return in 1 month for recheck BP and chest pain. - Metoprolol 25 mg BID - Nitro pRN - Lipitor 40  mg - Lisinopril 10 mg - Protonix 40 mg

## 2015-03-01 ENCOUNTER — Other Ambulatory Visit: Payer: Self-pay | Admitting: Internal Medicine

## 2015-03-01 ENCOUNTER — Telehealth: Payer: Self-pay | Admitting: *Deleted

## 2015-03-01 DIAGNOSIS — J449 Chronic obstructive pulmonary disease, unspecified: Secondary | ICD-10-CM

## 2015-03-01 MED ORDER — FLUTICASONE-SALMETEROL 230-21 MCG/ACT IN AERO: 2.0000 | INHALATION_SPRAY | Freq: Two times a day (BID) | RESPIRATORY_TRACT | Status: DC

## 2015-03-01 NOTE — Addendum Note (Signed)
Addended by: Viviano Simas A on: 03/01/2015 02:55 PM   Modules accepted: Orders, Medications

## 2015-03-01 NOTE — Telephone Encounter (Signed)
Received PA request from pt's pharmacy for Dulera-medication is non preferred.  Medication alternatives includes Advair Diskus, Advair HFA, Breo Ellipta, and symbicort.  Spoke with pt and he stated that he had taken Advair in the past, but thought insurance would no longer pay for it.  Dulera non longer on pt's medication list, will speak with pcp to see which corticosteroid combination pt is currently suppose to be on.  Of note, pt states his nebulizer machine will be in this week and  currently has Proventil (albuterol) and Spiriva inhalers.   Please advise.Despina Hidden Cassady1/9/20172:19 PM     Cover my Meds  Last name: Foil DOB: [birth year] Key: [place] ID # [id number] PHONE# [phone] FAX# [phone]

## 2015-03-03 NOTE — Telephone Encounter (Signed)
Spoke with pt's PCP-pt does NOT have to take Advair or Dulera at this time.  Pt is to continue using the albuterol (prn) and spiriva (daily) inhalers.  Pt also stated that nebulizer machine came today and he has stopped smoking since his last visit.  Pt congratulated on his smoking cessation progress and instructed to contact the clinic if we could further assist him to stay smoke-free.  Phone call complete.Despina Hidden Cassady1/11/20174:50 PM

## 2015-03-24 ENCOUNTER — Ambulatory Visit: Payer: Medicare Other | Admitting: Internal Medicine

## 2015-05-07 ENCOUNTER — Encounter: Payer: Self-pay | Admitting: Internal Medicine

## 2015-05-07 ENCOUNTER — Ambulatory Visit (INDEPENDENT_AMBULATORY_CARE_PROVIDER_SITE_OTHER): Payer: Medicare Other | Admitting: Internal Medicine

## 2015-05-07 ENCOUNTER — Other Ambulatory Visit (HOSPITAL_COMMUNITY)
Admission: RE | Admit: 2015-05-07 | Discharge: 2015-05-07 | Disposition: A | Discharge status: Home / Self Care | Payer: Medicare Other | Source: Ambulatory Visit | Attending: Internal Medicine | Admitting: Internal Medicine

## 2015-05-07 VITALS — BP 180/98 | HR 96 | Temp 98.6°F | Ht 69.0 in | Wt 172.6 lb

## 2015-05-07 DIAGNOSIS — A6001 Herpesviral infection of penis: Secondary | ICD-10-CM | POA: Diagnosis not present

## 2015-05-07 DIAGNOSIS — Z0000 Encounter for general adult medical examination without abnormal findings: Secondary | ICD-10-CM

## 2015-05-07 DIAGNOSIS — I10 Essential (primary) hypertension: Secondary | ICD-10-CM

## 2015-05-07 DIAGNOSIS — Z7251 High risk heterosexual behavior: Secondary | ICD-10-CM | POA: Insufficient documentation

## 2015-05-07 DIAGNOSIS — Z113 Encounter for screening for infections with a predominantly sexual mode of transmission: Secondary | ICD-10-CM | POA: Diagnosis not present

## 2015-05-07 DIAGNOSIS — K219 Gastro-esophageal reflux disease without esophagitis: Secondary | ICD-10-CM

## 2015-05-07 DIAGNOSIS — A609 Anogenital herpesviral infection, unspecified: Secondary | ICD-10-CM | POA: Diagnosis not present

## 2015-05-07 DIAGNOSIS — F17210 Nicotine dependence, cigarettes, uncomplicated: Secondary | ICD-10-CM | POA: Diagnosis not present

## 2015-05-07 DIAGNOSIS — J449 Chronic obstructive pulmonary disease, unspecified: Secondary | ICD-10-CM

## 2015-05-07 DIAGNOSIS — A6002 Herpesviral infection of other male genital organs: Secondary | ICD-10-CM

## 2015-05-07 MED ORDER — PANTOPRAZOLE SODIUM 40 MG PO TBEC: 40.0000 mg | DELAYED_RELEASE_TABLET | Freq: Every day | ORAL | Status: AC

## 2015-05-07 MED ORDER — ALBUTEROL SULFATE HFA 108 (90 BASE) MCG/ACT IN AERS: 2.0000 | INHALATION_SPRAY | Freq: Four times a day (QID) | RESPIRATORY_TRACT | Status: DC | PRN

## 2015-05-07 MED ORDER — FLUTICASONE-SALMETEROL 230-21 MCG/ACT IN AERO: 2.0000 | INHALATION_SPRAY | Freq: Two times a day (BID) | RESPIRATORY_TRACT | Status: DC

## 2015-05-07 MED ORDER — VALACYCLOVIR HCL 1 G PO TABS: 1000.0000 mg | ORAL_TABLET | Freq: Two times a day (BID) | ORAL | Status: AC

## 2015-05-07 MED ORDER — TIOTROPIUM BROMIDE MONOHYDRATE 18 MCG IN CAPS: 18.0000 ug | ORAL_CAPSULE | Freq: Every day | RESPIRATORY_TRACT | Status: AC

## 2015-05-07 MED ORDER — LISINOPRIL 20 MG PO TABS: 20.0000 mg | ORAL_TABLET | Freq: Every day | ORAL | Status: DC

## 2015-05-07 NOTE — Assessment & Plan Note (Signed)
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  Will check patient for other STIs.  He will need repeat HIV check at next visit due to concern for testing in the window period. - RPR, HIV, GC/Chlamydia - Valacyclovir 1 g BID x 10 days - Repeat HIV at next visit in 2 months

## 2015-05-07 NOTE — Patient Instructions (Addendum)
1. INCREASE Lisinopril to 20 mg daily. 2. Take Spiriva EVERY day. 3. Take Advair TWICE daily EVERY day. 4. Take Valacyclovir 1g twice daily for 10 days. 5. Follow up for pulmonary function tests. 6. Return to clinic in 2 months.  Genital Herpes Genital herpes is a common sexually transmitted infection (STI) that is caused by a virus. The virus is spread from person to person through sexual contact. Infection can cause itching, blisters, and sores in the genital area or rectal area. This is called an outbreak. It affects both men and women. Genital herpes is particularly concerning for pregnant women because the virus can be passed to the baby during delivery and cause serious problems. Genital herpes is also a concern for people with a weakened defense (immune) system. Symptoms of genital herpes may last several days and then go away. However, the virus remains in your body, so you may have more outbreaks of symptoms in the future. The time between outbreaks varies and can be months or years. CAUSES Genital herpes is caused by a virus called herpes simplex virus (HSV) type 2 or HSV type 1. These viruses are contagious and are most often spread through sexual contact with an infected person. Sexual contact includes vaginal, anal, and oral sex. RISK FACTORS Risk factors for genital herpes include:  Being sexually active with multiple partners.  Having unprotected sex. SIGNS AND SYMPTOMS Symptoms may include:  Pain and itching in the genital area or rectal area.  Small red bumps that turn into blisters and then turn into sores.  Flu-like symptoms, including:  Fever.  Body aches.  Painful urination.  Vaginal discharge. DIAGNOSIS Genital herpes may be diagnosed by:  Physical exam.  Blood test.  Fluid culture test from an open sore. TREATMENT There is no cure for genital herpes. Oral antiviral medicines may be used to speed up healing and to help prevent the return of symptoms.  These medicines can also help to reduce the spread of the virus to sexual partners. HOME CARE INSTRUCTIONS  Keep the affected areas dry and clean.  Take medicines only as directed by your health care provider.  Do not have sexual contact during active infections. Genital herpes is contagious.  Practice safe sex. Latex condoms and male condoms may help to prevent the spread of the herpes virus.  Avoid rubbing or touching the blisters and sores. If you do touch the blister or sores:  Wash your hands thoroughly.  Do not touch your eyes afterward.  If you become pregnant, tell your health care provider if you have had genital herpes.  Keep all follow-up visits as directed by your health care provider. This is important. PREVENTION  Use condoms. Although anyone can contract genital herpes during sexual contact even with the use of a condom, a condom can provide some protection.  Avoid having multiple sexual partners.  Talk to your sexual partner about any symptoms and past history that either of you may have.  Get tested before you have sex. Ask your partner to do the same.  Recognize the symptoms of genital herpes. Do not have sexual contact if you notice these symptoms. SEEK MEDICAL CARE IF:  Your symptoms are not improving with medicine.  Your symptoms return.  You have new symptoms.  You have a fever.  You have abdominal pain.  You have redness, swelling, or pain in your eye. MAKE SURE YOU:  Understand these instructions.  Will watch your condition.  Will get help right away if you  are not doing well or get worse.   This information is not intended to replace advice given to you by your health care provider. Make sure you discuss any questions you have with your health care provider.   Document Released: 02/04/2000 Document Revised: 02/27/2014 Document Reviewed: 06/24/2013 Elsevier Interactive Patient Education [employer].

## 2015-05-07 NOTE — Assessment & Plan Note (Addendum)
A/P: COPD, unknown stage as we do not have PFTs.  Multiple night time awakening demonstrates poor control however, so will add ICS/LABA to regimen and encourage Spiriva daily and smoking cessation.  Advair is not covered by his insurance, so will try Symbicort. - Albuterol PRN - Spiriva daily - Symbicort 180 BID  - PFTs. - Smoking cessation

## 2015-05-07 NOTE — Progress Notes (Signed)
Patient ID: Gregory Pena, male   DOB: [birth year], 60 y.o.   MRN: [id number]   Subjective:   Patient ID: Gregory Pena male   DOB: 12/09/1955 60 y.o.   MRN: [id number]  HPI: [name] is a 60 y.o. male with PMH as below, here for f/u COPD and HTN.  Please see Problem-Based charting for the status of the patient's chronic medical issues.  Patient has been having 1 month of sinus congestion, PND, and cough productive of clear/white sputum.  He is using his Albuterol once every 2-3 days to 2-3 times per day.  He has 2-3 night time awakenings.  He is only using Spiriva intermittently.  He was not able to get [facility] after last visit due to insurance concerns.  He is a current smoker, smoking 1 pack every 3-4 days.  He would also like to be checked for STDs.  His previous male partner has told him that he has HIV.  He has noticed small red bumps on his penis that hurt.  He denies drainage or other rashes. He denies fever, chills, abdominal pain, or burning with urination.     Past Medical History  Diagnosis Date  . Hypertension   . Seizures ([place])   . Myocardial infarction ([place])   . Stroke ([place])   . CHF (congestive heart failure) ([place])   . COPD (chronic obstructive pulmonary disease) ([place])   . Coronary artery disease   . Shortness of breath dyspnea   . Anxiety   . GERD (gastroesophageal reflux disease)   . 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 Medical non-compliance    Current Outpatient Prescriptions  Medication Sig Dispense Refill  . acetaminophen (TYLENOL) 500 MG tablet Take 1 tablet (500 mg total) by mouth every 6 (six) hours as needed. 30 tablet 0  . albuterol (PROVENTIL HFA;VENTOLIN HFA) 108 (90 Base) MCG/ACT inhaler Inhale 2 puffs into the lungs every 6 (six) hours as needed for wheezing or shortness of breath. 1 Inhaler 3  . albuterol (PROVENTIL) (2.5 MG/3ML) 0.083% nebulizer solution Take 3 mLs (2.5 mg total) by nebulization every 6 (six) hours as needed for wheezing or  shortness of breath. 75 mL 12  . aspirin EC 81 MG EC tablet Take 1 tablet (81 mg total) by mouth daily. 30 tablet 0  . atorvastatin (LIPITOR) 40 MG tablet Take 1 tablet (40 mg total) by mouth daily at 6 PM. 90 tablet 1  . Cyanocobalamin (VITAMIN B 12 PO) Take 1 tablet by mouth daily.    . fluticasone-salmeterol (ADVAIR HFA) 230-21 MCG/ACT inhaler Inhale 2 puffs into the lungs 2 (two) times daily. 1 Inhaler 3  . gabapentin (NEURONTIN) 300 MG capsule Take 1 capsule (300 mg total) by mouth 3 (three) times daily. 90 capsule 1  . lisinopril (PRINIVIL,ZESTRIL) 20 MG tablet Take 1 tablet (20 mg total) by mouth daily. 30 tablet 3  . Menthol (COUGH DROPS) 10 MG LOZG Use as directed 1 lozenge in the mouth or throat as needed (for cough).    . metoprolol tartrate (LOPRESSOR) 25 MG tablet Take 1 tablet (25 mg total) by mouth 2 (two) times daily. 60 tablet 1  . Multiple Vitamins-Minerals (CENTRUM ADULTS PO) Take 1 tablet by mouth daily.    . nitroGLYCERIN (NITROSTAT) 0.4 MG SL tablet Place 1 tablet (0.4 mg total) under the tongue every 5 (five) minutes as needed for chest pain. 30 tablet 1  . pantoprazole (PROTONIX) 40 MG tablet Take 1 tablet (40 mg total) by mouth daily.  30 tablet 0  . tiotropium (SPIRIVA HANDIHALER) 18 MCG inhalation capsule Place 1 capsule (18 mcg total) into inhaler and inhale daily. 30 capsule 2  . valACYclovir (VALTREX) 1000 MG tablet Take 1 tablet (1,000 mg total) by mouth 2 (two) times daily. 20 tablet 0   No current facility-administered medications for this visit.   Family History  Problem Relation Age of Onset  . Coronary artery disease Neg Hx    Social History   Social History  . Marital Status: Single    Spouse Name: N/A  . Number of Children: N/A  . Years of Education: N/A   Occupational History  . Divorced    Social History Main Topics  . Smoking status: Light Tobacco Smoker -- 0.50 packs/day for 30 years    Types: Cigarettes  . Smokeless tobacco: Former Systems developer      Comment: considering quitting / [place]  . Alcohol Use: 0.0 oz/week    0 Standard drinks or equivalent per week     Comment: 1-2 beers per day  . Drug Use: Yes    Special: Marijuana, Cocaine     Comment: hx of drug use, cocaine and marijuana  . Sexual Activity: Not Asked   Other Topics Concern  . None   Social History Narrative   Review of Systems: Pertinent items are noted in HPI. Objective:  Physical Exam: Filed Vitals:   05/07/15 1422 05/07/15 1445  BP: 169/98 180/98  Pulse: 97 96  Temp: 98.6 F (37 C)   TempSrc: Oral   Height: 5\' 9"  (1.753 m)   Weight: 172 lb 9.6 oz (78.291 kg)   SpO2: 96%    Physical Exam  Constitutional: He is oriented to person, place, and time. No distress.  HENT:  Head: Normocephalic and atraumatic.  Eyes: EOM are normal. No scleral icterus.  Neck: No JVD present. No tracheal deviation present.  Cardiovascular: Normal rate, regular rhythm and normal heart sounds.   Pulmonary/Chest: Effort normal. No stridor. No respiratory distress. He has no wheezes.  No wheezes appreciated.  Abdominal: Soft. He exhibits no distension. There is no tenderness. There is no rebound and no guarding.  Genitourinary:  Penis without urethral drainage.  Grouped vesicles on an erythematous base present on right side of penile shaft.  Neurological: He is alert and oriented to person, place, and time.  Skin: Skin is warm and dry. He is not diaphoretic.     Assessment & Plan:   Patient and case were discussed with Dr. Lynnae January.  Please refer to Problem Based charting for further documentation.

## 2015-05-07 NOTE — Assessment & Plan Note (Signed)
BP Readings from Last 3 Encounters:  05/07/15 180/98  02/24/15 141/94  02/08/15 117/74    Lab Results  Component Value Date   NA 139 02/08/2015   K 4.4 02/08/2015   CREATININE 0.90 02/08/2015    Assessment: Blood pressure control:  poor Progress toward BP goal:    Comments: Patient upset after receiving news of genital herpes infection.  Plan: Medications:  Increase Lisinopril to 20 mg daily.  Continue Metoprolol Educational resources provided:   Self management tools provided:   Other plans: Recheck BP in 2 months

## 2015-05-08 LAB — HIV ANTIBODY (ROUTINE TESTING W REFLEX): HIV SCREEN 4TH GENERATION: NONREACTIVE

## 2015-05-08 LAB — RPR: RPR: NONREACTIVE

## 2015-05-10 ENCOUNTER — Telehealth: Payer: Self-pay | Admitting: Internal Medicine

## 2015-05-10 LAB — URINE CYTOLOGY ANCILLARY ONLY
Chlamydia: NEGATIVE
Neisseria Gonorrhea: NEGATIVE

## 2015-05-10 NOTE — Telephone Encounter (Signed)
Calling about PA for advair

## 2015-05-10 NOTE — Telephone Encounter (Signed)
Gregory Pena from [place] requesting the nurse to call back.

## 2015-05-10 NOTE — Telephone Encounter (Signed)
Sent to [facility]

## 2015-05-10 NOTE — Telephone Encounter (Signed)
Pt states Advair needs PA.

## 2015-05-10 NOTE — Progress Notes (Signed)
Internal Medicine Clinic Attending  Case discussed with Dr. Taylor at the time of the visit.  We reviewed the resident's history and exam and pertinent patient test results.  I agree with the assessment, diagnosis, and plan of care documented in the resident's note. 

## 2015-05-20 ENCOUNTER — Telehealth: Payer: Self-pay | Admitting: Internal Medicine

## 2015-05-20 NOTE — Telephone Encounter (Signed)
Ulis Rias, any update on PA?  Patient calling stating he has had Advair before but never had a problem.

## 2015-05-20 NOTE — Telephone Encounter (Signed)
Needs to speak to nurse °

## 2015-05-21 ENCOUNTER — Encounter (HOSPITAL_COMMUNITY): Payer: Self-pay | Admitting: Emergency Medicine

## 2015-05-21 ENCOUNTER — Emergency Department (HOSPITAL_COMMUNITY): Payer: Medicare Other

## 2015-05-21 DIAGNOSIS — R079 Chest pain, unspecified: Secondary | ICD-10-CM | POA: Insufficient documentation

## 2015-05-21 DIAGNOSIS — J441 Chronic obstructive pulmonary disease with (acute) exacerbation: Secondary | ICD-10-CM | POA: Insufficient documentation

## 2015-05-21 DIAGNOSIS — Z7951 Long term (current) use of inhaled steroids: Secondary | ICD-10-CM | POA: Diagnosis not present

## 2015-05-21 DIAGNOSIS — I252 Old myocardial infarction: Secondary | ICD-10-CM | POA: Diagnosis not present

## 2015-05-21 DIAGNOSIS — Z951 Presence of aortocoronary bypass graft: Secondary | ICD-10-CM | POA: Insufficient documentation

## 2015-05-21 DIAGNOSIS — F17210 Nicotine dependence, cigarettes, uncomplicated: Secondary | ICD-10-CM | POA: Insufficient documentation

## 2015-05-21 DIAGNOSIS — Z8673 Personal history of transient ischemic attack (TIA), and cerebral infarction without residual deficits: Secondary | ICD-10-CM | POA: Diagnosis not present

## 2015-05-21 DIAGNOSIS — K219 Gastro-esophageal reflux disease without esophagitis: Secondary | ICD-10-CM | POA: Insufficient documentation

## 2015-05-21 DIAGNOSIS — Z7982 Long term (current) use of aspirin: Secondary | ICD-10-CM | POA: Diagnosis not present

## 2015-05-21 DIAGNOSIS — Z79899 Other long term (current) drug therapy: Secondary | ICD-10-CM | POA: Diagnosis not present

## 2015-05-21 DIAGNOSIS — Z9119 Patient's noncompliance with other medical treatment and regimen: Secondary | ICD-10-CM | POA: Diagnosis not present

## 2015-05-21 DIAGNOSIS — I509 Heart failure, unspecified: Secondary | ICD-10-CM | POA: Diagnosis not present

## 2015-05-21 DIAGNOSIS — F419 Anxiety disorder, unspecified: Secondary | ICD-10-CM | POA: Insufficient documentation

## 2015-05-21 DIAGNOSIS — I10 Essential (primary) hypertension: Secondary | ICD-10-CM | POA: Insufficient documentation

## 2015-05-21 DIAGNOSIS — I2510 Atherosclerotic heart disease of native coronary artery without angina pectoris: Secondary | ICD-10-CM | POA: Diagnosis not present

## 2015-05-21 DIAGNOSIS — R0789 Other chest pain: Secondary | ICD-10-CM | POA: Diagnosis not present

## 2015-05-21 DIAGNOSIS — R0602 Shortness of breath: Secondary | ICD-10-CM | POA: Diagnosis not present

## 2015-05-21 LAB — BASIC METABOLIC PANEL
Anion gap: 11 (ref 5–15)
BUN: 11 mg/dL (ref 6–20)
CALCIUM: 10.1 mg/dL (ref 8.9–10.3)
CO2: 20 mmol/L — ABNORMAL LOW (ref 22–32)
CREATININE: 0.98 mg/dL (ref 0.61–1.24)
Chloride: 109 mmol/L (ref 101–111)
GFR calc Af Amer: >60 mL/min (ref >60)
Glucose, Bld: 87 mg/dL (ref 65–99)
POTASSIUM: 3.9 mmol/L (ref 3.5–5.1)
SODIUM: 140 mmol/L (ref 135–145)

## 2015-05-21 LAB — CBC
HCT: 46.9 % (ref 39.0–52.0)
Hemoglobin: 15.3 g/dL (ref 13.0–17.0)
MCH: 30.9 pg (ref 26.0–34.0)
MCHC: 32.6 g/dL (ref 30.0–36.0)
MCV: 94.7 fL (ref 78.0–100.0)
PLATELETS: 324 10*3/uL (ref 150–400)
RBC: 4.95 MIL/uL (ref 4.22–5.81)
RDW: 15.3 % (ref 11.5–15.5)
WBC: 9.9 10*3/uL (ref 4.0–10.5)

## 2015-05-21 LAB — I-STAT TROPONIN, ED: TROPONIN I, POC: 0 ng/mL (ref 0.00–0.08)

## 2015-05-21 MED ORDER — BUDESONIDE-FORMOTEROL FUMARATE 160-4.5 MCG/ACT IN AERO: 2.0000 | INHALATION_SPRAY | Freq: Two times a day (BID) | RESPIRATORY_TRACT | Status: AC

## 2015-05-21 NOTE — ED Notes (Signed)
Pt. reports central chest pain with mild SOB onset this evening , denies nausea or diaphoresis , pt. added elevated blood pressure at home this evening , right side neck pain and bilateral shoulder pain .

## 2015-05-21 NOTE — Addendum Note (Signed)
Addended by: Viviano Simas A on: 05/21/2015 01:11 PM   Modules accepted: Orders, Medications

## 2015-05-21 NOTE — Telephone Encounter (Signed)
PA was denied-[employer] no longer paying for Advair HFA and is asking MD to try patient on Symbicort HFA or Breo  Ellipta powder inhalation.  Discussed with DrTaylor, new rx for symbicort sent to pharmacy-will inform patient.Marland Kitchenkg

## 2015-05-22 ENCOUNTER — Emergency Department (HOSPITAL_COMMUNITY)
Admission: EM | Admit: 2015-05-22 | Discharge: 2015-05-22 | Disposition: A | Discharge status: Home / Self Care | Payer: Medicare Other | Attending: Emergency Medicine | Admitting: Emergency Medicine

## 2015-05-22 DIAGNOSIS — R079 Chest pain, unspecified: Secondary | ICD-10-CM | POA: Diagnosis not present

## 2015-05-22 DIAGNOSIS — J441 Chronic obstructive pulmonary disease with (acute) exacerbation: Secondary | ICD-10-CM

## 2015-05-22 DIAGNOSIS — J449 Chronic obstructive pulmonary disease, unspecified: Secondary | ICD-10-CM

## 2015-05-22 MED ORDER — CYCLOBENZAPRINE HCL 10 MG PO TABS: 10.0000 mg | ORAL_TABLET | Freq: Three times a day (TID) | ORAL | Status: DC | PRN

## 2015-05-22 MED ORDER — LORAZEPAM 2 MG/ML IJ SOLN
1.0000 mg | Freq: Once | INTRAMUSCULAR | Status: AC
  Administered 2015-05-22: 1 mg via INTRAVENOUS
  Filled 2015-05-22: qty 1

## 2015-05-22 MED ORDER — ONDANSETRON HCL 4 MG/2ML IJ SOLN
4.0000 mg | Freq: Once | INTRAMUSCULAR | Status: AC
  Administered 2015-05-22: 4 mg via INTRAVENOUS
  Filled 2015-05-22: qty 2

## 2015-05-22 MED ORDER — ALBUTEROL SULFATE HFA 108 (90 BASE) MCG/ACT IN AERS: 2.0000 | INHALATION_SPRAY | Freq: Four times a day (QID) | RESPIRATORY_TRACT | Status: DC | PRN

## 2015-05-22 MED ORDER — ALBUTEROL SULFATE (2.5 MG/3ML) 0.083% IN NEBU
5.0000 mg | INHALATION_SOLUTION | Freq: Once | RESPIRATORY_TRACT | Status: AC
  Administered 2015-05-22: 5 mg via RESPIRATORY_TRACT
  Filled 2015-05-22: qty 6

## 2015-05-22 MED ORDER — IPRATROPIUM BROMIDE 0.02 % IN SOLN
0.5000 mg | Freq: Once | RESPIRATORY_TRACT | Status: AC
Start: 2015-05-22 — End: 2015-05-22
  Administered 2015-05-22: 0.5 mg via RESPIRATORY_TRACT
  Filled 2015-05-22: qty 2.5

## 2015-05-22 MED ORDER — PREDNISONE 20 MG PO TABS
60.0000 mg | ORAL_TABLET | Freq: Once | ORAL | Status: AC
  Administered 2015-05-22: 60 mg via ORAL
  Filled 2015-05-22: qty 3

## 2015-05-22 MED ORDER — CYCLOBENZAPRINE HCL 10 MG PO TABS
5.0000 mg | ORAL_TABLET | Freq: Once | ORAL | Status: AC
  Administered 2015-05-22: 5 mg via ORAL
  Filled 2015-05-22: qty 1

## 2015-05-22 MED ORDER — HYDROMORPHONE HCL 1 MG/ML IJ SOLN
1.0000 mg | Freq: Once | INTRAMUSCULAR | Status: AC
  Administered 2015-05-22: 1 mg via INTRAVENOUS
  Filled 2015-05-22: qty 1

## 2015-05-22 MED ORDER — LABETALOL HCL 5 MG/ML IV SOLN
20.0000 mg | Freq: Once | INTRAVENOUS | Status: AC
  Administered 2015-05-22: 20 mg via INTRAVENOUS
  Filled 2015-05-22: qty 4

## 2015-05-22 MED ORDER — DIAZEPAM 5 MG/ML IJ SOLN: 2.5000 mg | Freq: Once | INTRAMUSCULAR | Status: DC

## 2015-05-22 MED ORDER — PREDNISONE 10 MG PO TABS: 60.0000 mg | ORAL_TABLET | Freq: Every day | ORAL | Status: DC

## 2015-05-22 NOTE — ED Notes (Signed)
Dr. Campos at bedside   

## 2015-05-22 NOTE — Discharge Instructions (Signed)
Chronic Obstructive Pulmonary Disease Chronic obstructive pulmonary disease (COPD) is a common lung condition in which airflow from the lungs is limited. COPD is a general term that can be used to describe many different lung problems that limit airflow, including both chronic bronchitis and emphysema. If you have COPD, your lung function will probably never return to normal, but there are measures you can take to improve lung function and make yourself feel better. CAUSES   Smoking (common).  Exposure to secondhand smoke.  Genetic problems.  Chronic inflammatory lung diseases or recurrent infections. SYMPTOMS  Shortness of breath, especially with physical activity.  Deep, persistent (chronic) cough with a large amount of thick mucus.  Wheezing.  Rapid breaths (tachypnea).  Gray or bluish discoloration (cyanosis) of the skin, especially in your fingers, toes, or lips.  Fatigue.  Weight loss.  Frequent infections or episodes when breathing symptoms become much worse (exacerbations).  Chest tightness. DIAGNOSIS Your health care provider will take a medical history and perform a physical examination to diagnose COPD. Additional tests for COPD may include:  Lung (pulmonary) function tests.  Chest X-ray.  CT scan.  Blood tests. TREATMENT  Treatment for COPD may include:  Inhaler and nebulizer medicines. These help manage the symptoms of COPD and make your breathing more comfortable.  Supplemental oxygen. Supplemental oxygen is only helpful if you have a low oxygen level in your blood.  Exercise and physical activity. These are beneficial for nearly all people with COPD.  Lung surgery or transplant.  Nutrition therapy to gain weight, if you are underweight.  Pulmonary rehabilitation. This may involve working with a team of health care providers and specialists, such as respiratory, occupational, and physical therapists. HOME CARE INSTRUCTIONS  Take all medicines  (inhaled or pills) as directed by your health care provider.  Avoid over-the-counter medicines or cough syrups that dry up your airway (such as antihistamines) and slow down the elimination of secretions unless instructed otherwise by your health care provider.  If you are a smoker, the most important thing that you can do is stop smoking. Continuing to smoke will cause further lung damage and breathing trouble. Ask your health care provider for help with quitting smoking. He or she can direct you to community resources or hospitals that provide support.  Avoid exposure to irritants such as smoke, chemicals, and fumes that aggravate your breathing.  Use oxygen therapy and pulmonary rehabilitation if directed by your health care provider. If you require home oxygen therapy, ask your health care provider whether you should purchase a pulse oximeter to measure your oxygen level at home.  Avoid contact with individuals who have a contagious illness.  Avoid extreme temperature and humidity changes.  Eat healthy foods. Eating smaller, more frequent meals and resting before meals may help you maintain your strength.  Stay active, but balance activity with periods of rest. Exercise and physical activity will help you maintain your ability to do things you want to do.  Preventing infection and hospitalization is very important when you have COPD. Make sure to receive all the vaccines your health care provider recommends, especially the pneumococcal and influenza vaccines. Ask your health care provider whether you need a pneumonia vaccine.  Learn and use relaxation techniques to manage stress.  Learn and use controlled breathing techniques as directed by your health care provider. Controlled breathing techniques include:  Pursed lip breathing. Start by breathing in (inhaling) through your nose for 1 second. Then, purse your lips as if you were  going to whistle and breathe out (exhale) through the  pursed lips for 2 seconds.  Diaphragmatic breathing. Start by putting one hand on your abdomen just above your waist. Inhale slowly through your nose. The hand on your abdomen should move out. Then purse your lips and exhale slowly. You should be able to feel the hand on your abdomen moving in as you exhale.  Learn and use controlled coughing to clear mucus from your lungs. Controlled coughing is a series of short, progressive coughs. The steps of controlled coughing are: 1. Lean your head slightly forward. 2. Breathe in deeply using diaphragmatic breathing. 3. Try to hold your breath for 3 seconds. 4. Keep your mouth slightly open while coughing twice. 5. Spit any mucus out into a tissue. 6. Rest and repeat the steps once or twice as needed. SEEK MEDICAL CARE IF:  You are coughing up more mucus than usual.  There is a change in the color or thickness of your mucus.  Your breathing is more labored than usual.  Your breathing is faster than usual. SEEK IMMEDIATE MEDICAL CARE IF:  You have shortness of breath while you are resting.  You have shortness of breath that prevents you from:  Being able to talk.  Performing your usual physical activities.  You have chest pain lasting longer than 5 minutes.  Your skin color is more cyanotic than usual.  You measure low oxygen saturations for longer than 5 minutes with a pulse oximeter. MAKE SURE YOU:  Understand these instructions.  Will watch your condition.  Will get help right away if you are not doing well or get worse.   This information is not intended to replace advice given to you by your health care provider. Make sure you discuss any questions you have with your health care provider.   Document Released: 11/16/2004 Document Revised: 02/27/2014 Document Reviewed: 10/03/2012 Elsevier Interactive Patient Education 2016 Else[employer] Musculoskeletal Pain Musculoskeletal pain is muscle and boney aches and pains. These  pains can occur in any part of the body. Your caregiver may treat you without knowing the cause of the pain. They may treat you if blood or urine tests, X-rays, and other tests were normal.  CAUSES There is often not a definite cause or reason for these pains. These pains may be caused by a type of germ (virus). The discomfort may also come from overuse. Overuse includes working out too hard when your body is not fit. Boney aches also come from weather changes. Bone is sensitive to atmospheric pressure changes. HOME CARE INSTRUCTIONS   Ask when your test results will be ready. Make sure you get your test results.  Only take over-the-counter or prescription medicines for pain, discomfort, or fever as directed by your caregiver. If you were given medications for your condition, do not drive, operate machinery or power tools, or sign legal documents for 24 hours. Do not drink alcohol. Do not take sleeping pills or other medications that may interfere with treatment.  Continue all activities unless the activities cause more pain. When the pain lessens, slowly resume normal activities. Gradually increase the intensity and duration of the activities or exercise.  During periods of severe pain, bed rest may be helpful. Lay or sit in any position that is comfortable.  Putting ice on the injured area.  Put ice in a bag.  Place a towel between your skin and the bag.  Leave the ice on for 15 to 20 minutes, 3 to 4 times  a day.  Follow up with your caregiver for continued problems and no reason can be found for the pain. If the pain becomes worse or does not go away, it may be necessary to repeat tests or do additional testing. Your caregiver may need to look further for a possible cause. SEEK IMMEDIATE MEDICAL CARE IF:  You have pain that is getting worse and is not relieved by medications.  You develop chest pain that is associated with shortness or breath, sweating, feeling sick to your stomach  (nauseous), or throw up (vomit).  Your pain becomes localized to the abdomen.  You develop any new symptoms that seem different or that concern you. MAKE SURE YOU:   Understand these instructions.  Will watch your condition.  Will get help right away if you are not doing well or get worse.   This information is not intended to replace advice given to you by your health care provider. Make sure you discuss any questions you have with your health care provider.   Document Released: 02/06/2005 Document Revised: 05/01/2011 Document Reviewed: 10/11/2012 Elsevier Interactive Patient Education [employer].

## 2015-05-22 NOTE — ED Provider Notes (Signed)
CSN: [id number]     Arrival date & time 05/21/15  2159 History  By signing my name below, I, Gregory Pena, attest that this documentation has been prepared under the direction and in the presence of Jola Schmidt, MD. Electronically Signed: Julien Nordmann, ED Scribe. 05/22/2015. 2:35 AM.     Chief Complaint  Patient presents with  . Chest Pain  . Hypertension      The history is provided by the patient. No language interpreter was used.   HPI Comments: Keely Hulvey is a 60 y.o. male who has a PMHx of HTN, MI, CVA, CHF, COPD, CAD, GERD, and coronary artery vasospasm presents to the Emergency Department complaining of intermittent, gradual improving, moderate, sudden onset shortness of breath onset this evening upon laying down. Pt states he was laying down when he began to feel chest pressure and took to his O2. He states taking a nitro with minimal relief of symptoms.  Pt reports having pain in his right neck that radiates to his bilateral shoulders that started a few days ago. He describes the pain as a constant, burning sensation. He notes when he turns over he feels the burning sensation. He has taken some aleve to alleviate his pain with no relief. Pt also notes using icy hot on the area without any relief. He has not had this sensation before. Pt also reports having left foot numbness that started about one hour ago with associated spasms that occur whenever he moves his extremity. Denies any injury or loss of appetite. Pt is a smoker.  Pt does not currently have a cardiologist. Past Medical History  Diagnosis Date  . Hypertension   . Seizures ([place])   . Myocardial infarction ([place])   . Stroke ([place])   . CHF (congestive heart failure) ([place])   . COPD (chronic obstructive pulmonary disease) ([place])   . Coronary artery disease   . Shortness of breath dyspnea   . Anxiety   . GERD (gastroesophageal reflux disease)   . 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 Medical non-compliance    Past  Surgical History  Procedure Laterality Date  . Open heart surgery    . Coronary artery bypass graft    . Mouth surgery      jaw wiring    Family History  Problem Relation Age of Onset  . Coronary artery disease Neg Hx    Social History  Substance Use Topics  . Smoking status: Light Tobacco Smoker -- 0.50 packs/day for 30 years    Types: Cigarettes  . Smokeless tobacco: Former Systems developer     Comment: considering quitting / [place]  . Alcohol Use: Yes    Review of Systems  A complete 10 system review of systems was obtained and all systems are negative except as noted in the HPI and PMH.    Allergies  Dilantin and Vicodin  Home Medications   Prior to Admission medications   Medication Sig Start Date End Date Taking? Authorizing Provider  acetaminophen (TYLENOL) 500 MG tablet Take 1 tablet (500 mg total) by mouth every 6 (six) hours as needed. 11/19/13   Charlesetta Shanks, MD  albuterol (PROVENTIL HFA;VENTOLIN HFA) 108 (90 Base) MCG/ACT inhaler Inhale 2 puffs into the lungs every 6 (six) hours as needed for wheezing or shortness of breath. 05/07/15   Iline Oven, MD  albuterol (PROVENTIL) (2.5 MG/3ML) 0.083% nebulizer solution Take 3 mLs (2.5 mg total) by nebulization every 6 (six) hours as needed for wheezing  or shortness of breath. 02/24/15   Iline Oven, MD  aspirin EC 81 MG EC tablet Take 1 tablet (81 mg total) by mouth daily. 02/19/14   Juliet Rude, MD  atorvastatin (LIPITOR) 40 MG tablet Take 1 tablet (40 mg total) by mouth daily at 6 PM. 02/24/15   Iline Oven, MD  budesonide-formoterol [facility]) 160-4.5 MCG/ACT inhaler Inhale 2 puffs into the lungs 2 (two) times daily. 05/21/15   Iline Oven, MD  Cyanocobalamin (VITAMIN B 12 PO) Take 1 tablet by mouth daily.    Historical Provider, MD  gabapentin (NEURONTIN) 300 MG capsule Take 1 capsule (300 mg total) by mouth 3 (three) times daily. 02/24/15 02/24/16  Iline Oven, MD  lisinopril (PRINIVIL,ZESTRIL)  20 MG tablet Take 1 tablet (20 mg total) by mouth daily. 05/07/15   Iline Oven, MD  Menthol (COUGH DROPS) 10 MG LOZG Use as directed 1 lozenge in the mouth or throat as needed (for cough).    Historical Provider, MD  metoprolol tartrate (LOPRESSOR) 25 MG tablet Take 1 tablet (25 mg total) by mouth 2 (two) times daily. 02/24/15 02/24/16  Iline Oven, MD  Multiple Vitamins-Minerals (CENTRUM ADULTS PO) Take 1 tablet by mouth daily.    Historical Provider, MD  nitroGLYCERIN (NITROSTAT) 0.4 MG SL tablet Place 1 tablet (0.4 mg total) under the tongue every 5 (five) minutes as needed for chest pain. 02/24/15   Iline Oven, MD  pantoprazole (PROTONIX) 40 MG tablet Take 1 tablet (40 mg total) by mouth daily. 05/07/15   Iline Oven, MD  tiotropium (SPIRIVA HANDIHALER) 18 MCG inhalation capsule Place 1 capsule (18 mcg total) into inhaler and inhale daily. 05/07/15 05/06/16  Iline Oven, MD  valACYclovir (VALTREX) 1000 MG tablet Take 1 tablet (1,000 mg total) by mouth 2 (two) times daily. 05/07/15   Iline Oven, MD   Triage vitals: BP 163/106 mmHg  Pulse 92  Temp(Src) 97.7 F (36.5 C) (Oral)  Resp 20  SpO2 95% Physical Exam  Constitutional: He is oriented to person, place, and time. He appears well-developed and well-nourished.  HENT:  Head: Normocephalic and atraumatic.  Eyes: EOM are normal.  Neck: Normal range of motion.  Cardiovascular: Normal rate, regular rhythm, normal heart sounds and intact distal pulses.   Pulmonary/Chest: Effort normal. No respiratory distress. He has wheezes.  Wheezes bilaterally   Abdominal: Soft. He exhibits no distension. There is no tenderness.  Musculoskeletal: Normal range of motion.  Tenderness of trapezius regions bilaterally, no c-spine tenderness  Neurological: He is alert and oriented to person, place, and time.  Skin: Skin is warm and dry.  Psychiatric: He has a normal mood and affect. Judgment normal.  Nursing note and vitals  reviewed.   ED Course  Procedures  DIAGNOSTIC STUDIES: Oxygen Saturation is 95% on RA, adequate by my interpretation.  COORDINATION OF CARE:  2:33 AM Discussed treatment plan which includes albuterol treatment, atrovent, and prednisone with pt at bedside and pt agreed to plan.  Labs Review Labs Reviewed  BASIC METABOLIC PANEL - Abnormal; Notable for the following:    CO2 20 (*)    All other components within normal limits  CBC  I-STAT TROPOININ, ED    Imaging Review Dg Chest 2 View  05/21/2015  CLINICAL DATA:  Central chest pain, mild shortness of breath, onset of symptoms this evening, elevated blood pressure at home, RIGHT side neck and BILATERAL shoulder pain, history hypertension, coronary artery disease post MI  and CABG, CHF, COPD, GERD, stroke EXAM: CHEST  2 VIEW COMPARISON:  Of 19 2016 FINDINGS: Normal heart size post CABG. Mediastinal contours and pulmonary vascularity normal. Emphysematous and bronchitic changes consistent with COPD. No acute infiltrate, pleural effusion or pneumothorax. Mild atherosclerotic calcification aortic arch. Bones slightly demineralized. IMPRESSION: COPD changes. Post CABG. No acute abnormalities. Electronically Signed   By: Lavonia Dana M.D.   On: 05/21/2015 23:06   I have personally reviewed and evaluated these images and lab results as part of my medical decision-making.   EKG Interpretation   Date/Time:  Friday May 21 2015 22:03:51 EDT Ventricular Rate:  99 PR Interval:  168 QRS Duration: 96 QT Interval:  360 QTC Calculation: 462 R Axis:   85 Text Interpretation:  Normal sinus rhythm Possible Inferior infarct , age  undetermined Abnormal ECG nonspecific T wave changes as compared to prior  ecg Confirmed by Wali Reinheimer  MD, Lennette Bihari ([id number]) on 05/22/2015 2:35:03 AM      MDM   Final diagnoses:  Chest pain, unspecified chest pain type  COPD exacerbation ([place])   5:58 AM Patient feels much better this time.  Mild COPD exacerbation.  No  hypoxia.  Chest x-ray clear.  Home with prednisone and albuterol.  Primary care follow-up.  In regard to his neck and trapezius pain seems to be more musculoskeletal.  Home with Flexeril for this.  He's also having some spasms in his left leg.  He'll need primary care follow-up.  He has normal pulses in his bilateral PT and DP arteries.  Discharge home in good condition.  Primary care follow-up.  He understands to return to the ER for new or worsening symptoms   I personally performed the services described in this documentation, which was scribed in my presence. The recorded information has been reviewed and is accurate.     Jola Schmidt, MD 05/22/15 [id number]

## 2015-05-24 ENCOUNTER — Telehealth: Payer: Self-pay | Admitting: Emergency Medicine

## 2015-05-25 ENCOUNTER — Encounter (HOSPITAL_COMMUNITY): Payer: Medicare Other

## 2015-05-27 ENCOUNTER — Ambulatory Visit (HOSPITAL_COMMUNITY)
Admission: RE | Admit: 2015-05-27 | Discharge: 2015-05-27 | Disposition: A | Discharge status: Home / Self Care | Payer: Medicare Other | Source: Ambulatory Visit | Attending: Internal Medicine | Admitting: Internal Medicine

## 2015-05-27 DIAGNOSIS — J449 Chronic obstructive pulmonary disease, unspecified: Secondary | ICD-10-CM | POA: Insufficient documentation

## 2015-05-27 LAB — PULMONARY FUNCTION TEST
DL/VA % pred: 36 %
DL/VA: 1.63 ml/min/mmHg/L
DLCO UNC % PRED: 31 %
DLCO cor % pred: 30 %
DLCO cor: 9.32 ml/min/mmHg
DLCO unc: 9.49 ml/min/mmHg
FEF 25-75 POST: 0.73 L/s
FEF 25-75 PRE: 0.84 L/s
FEF2575-%Change-Post: -12 %
FEF2575-%PRED-PRE: 31 %
FEF2575-%Pred-Post: 27 %
FEV1-%Change-Post: -4 %
FEV1-%PRED-POST: 64 %
FEV1-%Pred-Pre: 67 %
FEV1-Post: 1.88 L
FEV1-Pre: 1.96 L
FEV1FVC-%CHANGE-POST: -5 %
FEV1FVC-%Pred-Pre: 72 %
FEV6-%Change-Post: -1 %
FEV6-%Pred-Post: 89 %
FEV6-%Pred-Pre: 90 %
FEV6-PRE: 3.28 L
FEV6-Post: 3.24 L
FEV6FVC-%Change-Post: -4 %
FEV6FVC-%PRED-PRE: 100 %
FEV6FVC-%Pred-Post: 96 %
FVC-%Change-Post: 1 %
FVC-%Pred-Post: 93 %
FVC-%Pred-Pre: 92 %
FVC-Post: 3.51 L
FVC-Pre: 3.47 L
Post FEV1/FVC ratio: 53 %
Post FEV6/FVC ratio: 92 %
Pre FEV1/FVC ratio: 57 %
Pre FEV6/FVC Ratio: 96 %
RV % pred: 124 %
RV: 2.74 L
TLC % pred: 96 %
TLC: 6.49 L

## 2015-05-27 MED ORDER — ALBUTEROL SULFATE (2.5 MG/3ML) 0.083% IN NEBU
2.5000 mg | INHALATION_SOLUTION | Freq: Once | RESPIRATORY_TRACT | Status: AC
  Administered 2015-05-27: 2.5 mg via RESPIRATORY_TRACT

## 2015-07-01 ENCOUNTER — Telehealth: Payer: Self-pay | Admitting: *Deleted

## 2015-07-16 ENCOUNTER — Encounter: Payer: Medicare Other | Admitting: Internal Medicine

## 2015-07-21 ENCOUNTER — Telehealth: Payer: Self-pay | Admitting: Licensed Clinical Social Worker

## 2015-07-21 NOTE — Telephone Encounter (Signed)
CSW placed called to pt.  CSW left message requesting return call. CSW provided contact hours and phone number.  CSW received return call from Mr. Gregory Pena.  Pt states he is active with Medicaid but has yet to transfer from his previous county to [facility].  CSW encouraged Mr. Gregory Pena to contact his previous county [place] caseworker to transfer benefits to [employer].  Pt has an appointment on 08/09/15.  CSW informed Mr. Gregory Pena if transfer can not be completed prior to appointment; request his current Medicaid caseworker to fax a request for out of county transportation to [facility] [phone].  In addition, CSW informed Mr. Gregory Pena, [facility] can provide [place] pass for transportation home from appointment.  However, Mr. Gregory Pena has a lengthy walk to/from fixed route.  CSW confirmed address and will mail [place] transportation options to Mr. Gregory Pena.

## 2015-07-26 ENCOUNTER — Telehealth: Payer: Self-pay | Admitting: Licensed Clinical Social Worker

## 2015-07-26 NOTE — Telephone Encounter (Signed)
Mr. Ivey returned call to [place].  Pt states he has received CSW information and also received documentation that his Medicaid has been transferred to [facility] as of 07/22/15.  Mr. Bel[place] reports that he has contacted Ms. Owens Shark at [facility] and requested a transportation assessment, but has yet to received information or a call back.  CSW encouraged Mr. Shintani to contact DSS to make reservation for transportation for his 6/19 appointment.  Mr. Mahannah states he may need to reschedule that appointment due to a potential conflict.  This worker stressed the need to contact transportation at least 3 business days before the appointment, as DSS will make no exception.  Pt aware he can schedule up to 2 weeks in advance.

## 2015-07-26 NOTE — Telephone Encounter (Signed)
CSW received voice mail from Gregory Pena request return call.  CSW placed called to pt.  CSW left message requesting return call. CSW provided contact hours and phone number.

## 2015-08-09 ENCOUNTER — Encounter: Payer: Self-pay | Admitting: Internal Medicine

## 2015-08-09 ENCOUNTER — Ambulatory Visit (INDEPENDENT_AMBULATORY_CARE_PROVIDER_SITE_OTHER): Payer: Medicare Other | Admitting: Internal Medicine

## 2015-08-09 VITALS — BP 160/95 | HR 97 | Temp 97.5°F | Ht 69.0 in | Wt 174.4 lb

## 2015-08-09 DIAGNOSIS — I2583 Coronary atherosclerosis due to lipid rich plaque: Secondary | ICD-10-CM

## 2015-08-09 DIAGNOSIS — F17210 Nicotine dependence, cigarettes, uncomplicated: Secondary | ICD-10-CM | POA: Diagnosis not present

## 2015-08-09 DIAGNOSIS — J301 Allergic rhinitis due to pollen: Secondary | ICD-10-CM

## 2015-08-09 DIAGNOSIS — Z7982 Long term (current) use of aspirin: Secondary | ICD-10-CM | POA: Diagnosis not present

## 2015-08-09 DIAGNOSIS — I2510 Atherosclerotic heart disease of native coronary artery without angina pectoris: Secondary | ICD-10-CM

## 2015-08-09 DIAGNOSIS — I10 Essential (primary) hypertension: Secondary | ICD-10-CM

## 2015-08-09 DIAGNOSIS — J449 Chronic obstructive pulmonary disease, unspecified: Secondary | ICD-10-CM | POA: Diagnosis not present

## 2015-08-09 DIAGNOSIS — G2581 Restless legs syndrome: Secondary | ICD-10-CM

## 2015-08-09 DIAGNOSIS — A609 Anogenital herpesviral infection, unspecified: Secondary | ICD-10-CM | POA: Diagnosis not present

## 2015-08-09 DIAGNOSIS — J309 Allergic rhinitis, unspecified: Secondary | ICD-10-CM | POA: Insufficient documentation

## 2015-08-09 DIAGNOSIS — R0602 Shortness of breath: Secondary | ICD-10-CM | POA: Diagnosis not present

## 2015-08-09 DIAGNOSIS — R05 Cough: Secondary | ICD-10-CM | POA: Diagnosis not present

## 2015-08-09 DIAGNOSIS — Z7251 High risk heterosexual behavior: Secondary | ICD-10-CM | POA: Diagnosis not present

## 2015-08-09 MED ORDER — LISINOPRIL 30 MG PO TABS: 30.0000 mg | ORAL_TABLET | Freq: Every day | ORAL | Status: AC

## 2015-08-09 MED ORDER — CETIRIZINE HCL 10 MG PO CAPS: 10.0000 mg | ORAL_CAPSULE | Freq: Every day | ORAL | Status: AC

## 2015-08-09 MED ORDER — METOPROLOL TARTRATE 25 MG PO TABS: 25.0000 mg | ORAL_TABLET | Freq: Two times a day (BID) | ORAL | Status: AC

## 2015-08-09 MED ORDER — GABAPENTIN 300 MG PO CAPS: 300.0000 mg | ORAL_CAPSULE | Freq: Three times a day (TID) | ORAL | Status: AC

## 2015-08-09 NOTE — Assessment & Plan Note (Signed)
Patient reports 2-3 months of runny nose and postnasal drip.  He has allergies to pollen and has previously taken benadryl. No fevers, chills, sinus congestion, or worsening SOB.  A/P: Allergic rhinitis.  Will start with Zyrtec and recheck in 3 months. - Zyrtec 10 mg once daily.

## 2015-08-09 NOTE — Assessment & Plan Note (Signed)
Patient reports compliance with Spiriva and Symbicort before he ran out.  He has refills available, but has not picked them up.  He is using his Albuterol inhaler twice daily and reports good control.  A/P: Gold Stage 2 COPD, controlled with Albuterol, Spiriva, and Symbicort.  Will continue current regimen and continue to support smoking cessation. Patient currently precontemplative, unfortunately. - continue current meds - stop smoking

## 2015-08-09 NOTE — Assessment & Plan Note (Signed)
Patient previously tested negative for HIV following unprotected sexual intercourse.  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   A/P: High-risk sexual behavior.  Will retest for HIV to ensure he was not tested in the window period. [ ]  HIV

## 2015-08-09 NOTE — Assessment & Plan Note (Signed)
Patient reports last taking his Metoprolol today, but last took Lisinopril yesterday because he ran out.  He denies dizziness or lightheadedness.  BP Readings from Last 3 Encounters:  08/09/15 160/95  05/22/15 112/74  05/07/15 180/98    Lab Results  Component Value Date   NA 140 05/21/2015   K 3.9 05/21/2015   CREATININE 0.98 05/21/2015    Assessment: Blood pressure control:  poor Progress toward BP goal:    Comments: Patient also continues to smoke  Plan: Medications:  Continue Metoprolol. Increase Lisinopril to 30 mg daily.  Educational resources provided:   Self management tools provided:   Other plans: smoking cessation. Patient precontemplative

## 2015-08-09 NOTE — Patient Instructions (Signed)
1. Use your Symbicort TWICE daily. 2. Use your Spiriva EVERY day. 3. Take Lisinopril 30 mg (1 tab) daily. 4. Take Cetirizine 10 mg EVERY day. 5. STOP SMOKING!!!  Chronic Obstructive Pulmonary Disease Chronic obstructive pulmonary disease (COPD) is a common lung condition in which airflow from the lungs is limited. COPD is a general term that can be used to describe many different lung problems that limit airflow, including both chronic bronchitis and emphysema. If you have COPD, your lung function will probably never return to normal, but there are measures you can take to improve lung function and make yourself feel better. CAUSES   Smoking (common).  Exposure to secondhand smoke.  Genetic problems.  Chronic inflammatory lung diseases or recurrent infections. SYMPTOMS  Shortness of breath, especially with physical activity.  Deep, persistent (chronic) cough with a large amount of thick mucus.  Wheezing.  Rapid breaths (tachypnea).  Gray or bluish discoloration (cyanosis) of the skin, especially in your fingers, toes, or lips.  Fatigue.  Weight loss.  Frequent infections or episodes when breathing symptoms become much worse (exacerbations).  Chest tightness. DIAGNOSIS Your health care provider will take a medical history and perform a physical examination to diagnose COPD. Additional tests for COPD may include:  Lung (pulmonary) function tests.  Chest X-ray.  CT scan.  Blood tests. TREATMENT  Treatment for COPD may include:  Inhaler and nebulizer medicines. These help manage the symptoms of COPD and make your breathing more comfortable.  Supplemental oxygen. Supplemental oxygen is only helpful if you have a low oxygen level in your blood.  Exercise and physical activity. These are beneficial for nearly all people with COPD.  Lung surgery or transplant.  Nutrition therapy to gain weight, if you are underweight.  Pulmonary rehabilitation. This may involve  working with a team of health care providers and specialists, such as respiratory, occupational, and physical therapists. HOME CARE INSTRUCTIONS  Take all medicines (inhaled or pills) as directed by your health care provider.  Avoid over-the-counter medicines or cough syrups that dry up your airway (such as antihistamines) and slow down the elimination of secretions unless instructed otherwise by your health care provider.  If you are a smoker, the most important thing that you can do is stop smoking. Continuing to smoke will cause further lung damage and breathing trouble. Ask your health care provider for help with quitting smoking. He or she can direct you to community resources or hospitals that provide support.  Avoid exposure to irritants such as smoke, chemicals, and fumes that aggravate your breathing.  Use oxygen therapy and pulmonary rehabilitation if directed by your health care provider. If you require home oxygen therapy, ask your health care provider whether you should purchase a pulse oximeter to measure your oxygen level at home.  Avoid contact with individuals who have a contagious illness.  Avoid extreme temperature and humidity changes.  Eat healthy foods. Eating smaller, more frequent meals and resting before meals may help you maintain your strength.  Stay active, but balance activity with periods of rest. Exercise and physical activity will help you maintain your ability to do things you want to do.  Preventing infection and hospitalization is very important when you have COPD. Make sure to receive all the vaccines your health care provider recommends, especially the pneumococcal and influenza vaccines. Ask your health care provider whether you need a pneumonia vaccine.  Learn and use relaxation techniques to manage stress.  Learn and use controlled breathing techniques as directed by  your health care provider. Controlled breathing techniques include:  Pursed lip  breathing. Start by breathing in (inhaling) through your nose for 1 second. Then, purse your lips as if you were going to whistle and breathe out (exhale) through the pursed lips for 2 seconds.  Diaphragmatic breathing. Start by putting one hand on your abdomen just above your waist. Inhale slowly through your nose. The hand on your abdomen should move out. Then purse your lips and exhale slowly. You should be able to feel the hand on your abdomen moving in as you exhale.  Learn and use controlled coughing to clear mucus from your lungs. Controlled coughing is a series of short, progressive coughs. The steps of controlled coughing are: 1. Lean your head slightly forward. 2. Breathe in deeply using diaphragmatic breathing. 3. Try to hold your breath for 3 seconds. 4. Keep your mouth slightly open while coughing twice. 5. Spit any mucus out into a tissue. 6. Rest and repeat the steps once or twice as needed. SEEK MEDICAL CARE IF:  You are coughing up more mucus than usual.  There is a change in the color or thickness of your mucus.  Your breathing is more labored than usual.  Your breathing is faster than usual. SEEK IMMEDIATE MEDICAL CARE IF:  You have shortness of breath while you are resting.  You have shortness of breath that prevents you from:  Being able to talk.  Performing your usual physical activities.  You have chest pain lasting longer than 5 minutes.  Your skin color is more cyanotic than usual.  You measure low oxygen saturations for longer than 5 minutes with a pulse oximeter. MAKE SURE YOU:  Understand these instructions.  Will watch your condition.  Will get help right away if you are not doing well or get worse.   This information is not intended to replace advice given to you by your health care provider. Make sure you discuss any questions you have with your health care provider.   Document Released: 11/16/2004 Document Revised: 02/27/2014 Document  Reviewed: 10/03/2012 Elsevier Interactive Patient Education [employer].

## 2015-08-09 NOTE — Progress Notes (Signed)
Patient ID: Gregory Pena, male   DOB: [birth year], 60 y.o.   MRN: [id number]   Subjective:   Patient ID: Gregory Pena male   DOB: June 04, 1955 60 y.o.   MRN: [id number]  HPI: [name] is a 60 y.o. male with PMH as below, here for f/u COPD and HTN.  Please see Problem-Based charting for the status of the patient's chronic medical issues.     Past Medical History  Diagnosis Date  . Hypertension   . Seizures ([place])   . Myocardial infarction ([place])   . Stroke ([place])   . CHF (congestive heart failure) ([place])   . COPD (chronic obstructive pulmonary disease) ([place])   . Coronary artery disease   . Shortness of breath dyspnea   . Anxiety   . GERD (gastroesophageal reflux disease)   . 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 Medical non-compliance    Current Outpatient Prescriptions  Medication Sig Dispense Refill  . albuterol (PROVENTIL HFA;VENTOLIN HFA) 108 (90 Base) MCG/ACT inhaler Inhale 2 puffs into the lungs every 6 (six) hours as needed for wheezing or shortness of breath. 1 Inhaler 3  . albuterol (PROVENTIL) (2.5 MG/3ML) 0.083% nebulizer solution Take 3 mLs (2.5 mg total) by nebulization every 6 (six) hours as needed for wheezing or shortness of breath. 75 mL 12  . aspirin EC 81 MG EC tablet Take 1 tablet (81 mg total) by mouth daily. 30 tablet 0  . atorvastatin (LIPITOR) 40 MG tablet Take 1 tablet (40 mg total) by mouth daily at 6 PM. 90 tablet 1  . budesonide-formoterol (SYMBICORT) 160-4.5 MCG/ACT inhaler Inhale 2 puffs into the lungs 2 (two) times daily. (Patient not taking: Reported on 05/22/2015) 1 Inhaler 12  . Cetirizine HCl 10 MG CAPS Take 1 capsule (10 mg total) by mouth daily. 90 capsule 2  . Cyanocobalamin (VITAMIN B 12 PO) Take 1 tablet by mouth daily.    . cyclobenzaprine (FLEXERIL) 10 MG tablet Take 1 tablet (10 mg total) by mouth 3 (three) times daily as needed for muscle spasms. 12 tablet 0  . gabapentin (NEURONTIN) 300 MG capsule Take 1 capsule (300 mg total) by mouth 3  (three) times daily. 270 capsule 1  . lisinopril (PRINIVIL,ZESTRIL) 30 MG tablet Take 1 tablet (30 mg total) by mouth daily. 90 tablet 3  . Menthol (COUGH DROPS) 10 MG LOZG Use as directed 1 lozenge in the mouth or throat as needed (for cough).    . metoprolol tartrate (LOPRESSOR) 25 MG tablet Take 1 tablet (25 mg total) by mouth 2 (two) times daily. 180 tablet 3  . nitroGLYCERIN (NITROSTAT) 0.4 MG SL tablet Place 1 tablet (0.4 mg total) under the tongue every 5 (five) minutes as needed for chest pain. 30 tablet 1  . pantoprazole (PROTONIX) 40 MG tablet Take 1 tablet (40 mg total) by mouth daily. 30 tablet 0  . predniSONE (DELTASONE) 10 MG tablet Take 6 tablets (60 mg total) by mouth daily. 30 tablet 0  . tiotropium (SPIRIVA HANDIHALER) 18 MCG inhalation capsule Place 1 capsule (18 mcg total) into inhaler and inhale daily. 30 capsule 2  . valACYclovir (VALTREX) 1000 MG tablet Take 1 tablet (1,000 mg total) by mouth 2 (two) times daily. 20 tablet 0   No current facility-administered medications for this visit.   Family History  Problem Relation Age of Onset  . Coronary artery disease Neg Hx    Social History   Social History  . Marital Status: Single    Spouse Name:  N/A  . Number of Children: N/A  . Years of Education: N/A   Occupational History  . Divorced    Social History Main Topics  . Smoking status: Light Tobacco Smoker -- 0.50 packs/day for 30 years    Types: Cigarettes  . Smokeless tobacco: Former Systems developer     Comment: considering quitting / [place]  . Alcohol Use: 0.0 oz/week    0 Standard drinks or equivalent per week  . Drug Use: Yes    Special: Marijuana, Cocaine     Comment: hx of drug use, cocaine and marijuana  . Sexual Activity: Not Asked   Other Topics Concern  . None   Social History Narrative   Review of Systems: No fevers, chills, congestion, dizziness, lightheadedness, or COPD.  Endorses cough, runny nose, postnasal drip, and intermittent  SOB. Objective:  Physical Exam: Filed Vitals:   08/09/15 1338  BP: 160/95  Pulse: 97  Temp: 97.5 F (36.4 C)  TempSrc: Oral  Height: 5\' 9"  (1.753 m)  Weight: 174 lb 6.4 oz (79.107 kg)  SpO2: 95%   Physical Exam  Constitutional: He is oriented to person, place, and time and well-developed, well-nourished, and in no distress. No distress.  HENT:  Head: Normocephalic and atraumatic.  Eyes: EOM are normal. No scleral icterus.  Neck: No tracheal deviation present.  Cardiovascular: Normal rate, regular rhythm and normal heart sounds.   Pulmonary/Chest: Effort normal and breath sounds normal. No stridor. No respiratory distress. He has no rales.  Scattered wheezes  Musculoskeletal: He exhibits no edema.  Neurological: He is alert and oriented to person, place, and time.  Skin: Skin is warm and dry. He is not diaphoretic.     Assessment & Plan:   Patient and case were discussed with Dr. Lynnae January.  Please refer to Problem Based charting for further documentation.

## 2015-08-10 LAB — HIV ANTIBODY (ROUTINE TESTING W REFLEX): HIV SCREEN 4TH GENERATION: NONREACTIVE

## 2015-08-10 NOTE — Progress Notes (Signed)
Internal Medicine Clinic Attending  Case discussed with Dr. Taylor at the time of the visit.  We reviewed the resident's history and exam and pertinent patient test results.  I agree with the assessment, diagnosis, and plan of care documented in the resident's note. 

## 2015-08-11 ENCOUNTER — Encounter: Payer: Self-pay | Admitting: *Deleted

## 2015-08-17 ENCOUNTER — Ambulatory Visit (INDEPENDENT_AMBULATORY_CARE_PROVIDER_SITE_OTHER): Payer: Medicare Other | Admitting: Internal Medicine

## 2015-08-17 ENCOUNTER — Encounter: Payer: Self-pay | Admitting: Internal Medicine

## 2015-08-17 VITALS — BP 134/110 | HR 92 | Temp 97.7°F | Ht 69.0 in | Wt 170.4 lb

## 2015-08-17 DIAGNOSIS — I10 Essential (primary) hypertension: Secondary | ICD-10-CM

## 2015-08-17 DIAGNOSIS — G8929 Other chronic pain: Secondary | ICD-10-CM

## 2015-08-17 DIAGNOSIS — M545 Low back pain, unspecified: Secondary | ICD-10-CM | POA: Insufficient documentation

## 2015-08-17 MED ORDER — CYCLOBENZAPRINE HCL 10 MG PO TABS: 10.0000 mg | ORAL_TABLET | Freq: Three times a day (TID) | ORAL | Status: DC | PRN

## 2015-08-17 MED ORDER — METHOCARBAMOL 500 MG PO TABS: 1000.0000 mg | ORAL_TABLET | Freq: Four times a day (QID) | ORAL | Status: DC | PRN

## 2015-08-17 NOTE — Assessment & Plan Note (Signed)
BP elevated, on lisinopril 30 mg + metoprolol 25mg  bid. Has not taken his meds today. \  Will keep on same regimen for now. F/up in 1 month.

## 2015-08-17 NOTE — Assessment & Plan Note (Signed)
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 No injuries, no fever, saddle anesthesia. He wanted to get some percocet to be able to sleep at night. I told him that's not a good option for chronic back pain. He has CAD so can't do NSAIDS. Used flexeril but states that did not help much. Using gabapentin already for RLS.  Neuro exam benign. No alarming symptoms.   Will do trial of robaxin 1000mg  q6hr prn Physical therapy referral RTC in 1 month.

## 2015-08-17 NOTE — Patient Instructions (Addendum)
Take Robaxin as needed 2 tablets every 6 hours.  Use heating pad.  Will try physical therapy.   Come back in 1 month to see how you are doing and also to recheck on your blood pressure.

## 2015-08-17 NOTE — Progress Notes (Signed)
   Subjective:    Patient ID: Gregory Pena, male    DOB: 1955-03-28, 60 y.o.   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  Has hx of chronic back pain, now pain is slightly worse than usual, now it's 8/10, on bilateral lower back, radiates down to the foot, had hx of DJD in the past, used to get steroid injection to the back, last was more than 10 years ago. Now not using anything for back pain, does use gabapentin for restless leg syndrome. Used heating pad. Not using flexeril currently.    HTN - BP elevated, on lisinopril 30 mg + metoprolol 25mg  bid. Has not taken his meds today.    Review of Systems  Constitutional: Negative for chills and diaphoresis.  HENT: Negative for congestion and sore throat.   Respiratory: Negative for cough and shortness of breath.   Cardiovascular: Negative for chest pain and leg swelling.  Musculoskeletal: Positive for back pain.  Neurological: Negative for dizziness and numbness.       Objective:   Physical Exam  Constitutional: He is oriented to person, place, and time. He appears well-developed and well-nourished. No distress.  HENT:  Head: Normocephalic and atraumatic.  Eyes: Conjunctivae are normal. Right eye exhibits no discharge. Left eye exhibits no discharge. No scleral icterus.  Neck: Normal range of motion.  Cardiovascular: Normal rate, regular rhythm, S1 normal, S2 normal and normal heart sounds.  Exam reveals no gallop and no friction rub.   No murmur heard. Pulmonary/Chest: Effort normal and breath sounds normal. No respiratory distress. He has no wheezes. He has no rales. He exhibits no tenderness.  Abdominal: Soft. Bowel sounds are normal. He exhibits no distension. There is no tenderness.  Musculoskeletal: Normal range of motion. He exhibits no edema or tenderness.  Has tenderness over paraspinal muscles on both sides on lower back   Neurological:  He is alert and oriented to person, place, and time. He has normal strength and normal reflexes. No cranial nerve deficit or sensory deficit.  Negative straight leg test and cross leg test.   Skin: He is not diaphoretic.  Psychiatric: He has a normal mood and affect.    Filed Vitals:   08/17/15 1431  BP: 134/110  Pulse: 92  Temp: 97.7 F (36.5 C)        Assessment & Plan:  See problem based a&p

## 2015-08-18 ENCOUNTER — Telehealth: Payer: Self-pay

## 2015-08-18 NOTE — Telephone Encounter (Signed)
Pt states he does not use [facility], he uses [facility], [facility], he will have it transferred

## 2015-08-18 NOTE — Telephone Encounter (Signed)
Pt states robaxin is not at the pharmacy. Please call pt back.

## 2015-08-18 NOTE — Progress Notes (Signed)
Internal Medicine Clinic Attending  Case discussed with Dr. Ahmed at the time of the visit.  We reviewed the resident's history and exam and pertinent patient test results.  I agree with the assessment, diagnosis, and plan of care documented in the resident's note. 

## 2015-08-19 ENCOUNTER — Other Ambulatory Visit: Payer: Self-pay

## 2015-08-19 NOTE — Telephone Encounter (Signed)
Pt needs sinus infection med to be filled.

## 2015-08-19 NOTE — Telephone Encounter (Signed)
Called patient, no answer, LVM

## 2015-09-06 ENCOUNTER — Ambulatory Visit: Payer: Medicare Other | Admitting: Physical Therapy

## 2015-09-15 DIAGNOSIS — M62838 Other muscle spasm: Secondary | ICD-10-CM | POA: Diagnosis not present

## 2015-09-15 DIAGNOSIS — M62830 Muscle spasm of back: Secondary | ICD-10-CM | POA: Diagnosis not present

## 2015-09-30 NOTE — Telephone Encounter (Signed)
review 

## 2015-10-11 ENCOUNTER — Ambulatory Visit: Payer: Medicare Other | Attending: Internal Medicine | Admitting: Physical Therapy

## 2015-10-11 ENCOUNTER — Encounter: Payer: Self-pay | Admitting: Physical Therapy

## 2015-10-11 DIAGNOSIS — M5442 Lumbago with sciatica, left side: Secondary | ICD-10-CM | POA: Diagnosis not present

## 2015-10-11 DIAGNOSIS — R2689 Other abnormalities of gait and mobility: Secondary | ICD-10-CM | POA: Insufficient documentation

## 2015-10-11 DIAGNOSIS — M5441 Lumbago with sciatica, right side: Secondary | ICD-10-CM | POA: Insufficient documentation

## 2015-10-11 NOTE — Therapy (Signed)
[place], [place], [address] Phone: [phone]   Fax:  [phone]  Physical Therapy Evaluation  Patient Details  Name: Gregory Pena MRN: [id number] Date of Birth: 27-Jul-1955 Referring Provider: Dellia Nims, MD  Encounter Date: 10/11/2015      PT End of Session - 10/11/15 1447    Visit Number 1   Number of Visits 16   Date for PT Re-Evaluation 12/11/15   PT Start Time [id number]   PT Stop Time 1425   PT Time Calculation (min) 50 min   Activity Tolerance Patient tolerated treatment well;Patient limited by pain   Behavior During Therapy [facility] for tasks assessed/performed      Past Medical History:  Diagnosis Date  . Anxiety   . CHF (congestive heart failure) ([place])   . COPD (chronic obstructive pulmonary disease) ([place])   . Coronary artery disease   . 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 GERD (gastroesophageal reflux disease)   . Hypertension   . Medical non-compliance   . Myocardial infarction ([place])   . Seizures ([place])   . Shortness of breath dyspnea   . Stroke [facility])     Past Surgical History:  Procedure Laterality Date  . CORONARY ARTERY BYPASS GRAFT    . MOUTH SURGERY     jaw wiring   . open heart surgery      There were no vitals filed for this visit.       Subjective Assessment - 10/11/15 1343    Subjective Pt arriving to physical therpay today c/o low back pain that has been worsening over the last few months. Pt reports his pain is intermittent, "some days are good, some days are bad".     Pertinent History CAD (No NSAIDS), HTN, h/o rupture disc in 1993 per pt report   How long can you sit comfortably? 5   How long can you stand comfortably? 5   How long can you walk comfortably? 10   Patient Stated Goals Try to get my pain to ease   Currently in Pain? Yes   Pain Score 8    Pain Location Back   Pain Orientation Lower;Right;Left   Pain Descriptors / Indicators Aching;Pressure    Pain Type Chronic pain   Pain Radiating Towards Both legs, more on the Right side   Pain Onset More than a month ago   Pain Frequency Intermittent   Aggravating Factors  walking, household chores, laying down a long time   Pain Relieving Factors pain meds, changing positions    Effect of Pain on Daily Activities Pt reports some days he has trouble with walking and ADL's.    Multiple Pain Sites No            [facility] PT Assessment - 10/11/15 0001      Assessment   Medical Diagnosis Low Back Pain   Referring Provider Dellia Nims, MD   Hand Dominance Right   Next MD Visit in the next month, pt unable to recall exact visit   Prior Therapy Cardiac Rehab     Balance Screen   Has the patient fallen in the past 6 months No  pt reporting near falls   Is the patient reluctant to leave their home because of a fear of falling?  No     Home Environment   Living Environment Private residence   Living Arrangements Other relatives   Type of [place] Access Stairs to enter  Entrance Stairs-Number of Steps 5   Entrance Stairs-Rails Left   Home Layout Two level;Able to live on main level with bedroom/bathroom   Home Equipment None     Prior Function   Level of Independence Independent   Vocation On disability   Leisure watch tv     Cognition   Overall Cognitive Status Within Functional Limits for tasks assessed     Coordination   Heel Shin Test intact bilaterally     ROM / Strength   AROM / PROM / Strength AROM;Strength     AROM   AROM Assessment Site Lumbar   Lumbar Flexion 50   Lumbar Extension 20   Lumbar - Right Side Bend 30   Lumbar - Left Side Bend 35   Lumbar - Right Rotation Within normal limits, but painful   Lumbar - Left Rotation within normal limits but painful     Strength   Overall Strength Comments pain with all strength testing   Strength Assessment Site Hip;Knee   Right/Left Hip Right;Left   Right Hip Flexion 4-/5   Right Hip Extension 3/5    Right Hip ABduction 3/5   Right Hip ADduction 3/5   Left Hip Flexion 4-/5   Left Hip Extension 3/5   Left Hip ABduction 3/5   Left Hip ADduction 3/5   Right/Left Knee Right;Left   Right Knee Flexion 4/5   Right Knee Extension 4/5   Left Knee Flexion 4/5   Left Knee Extension 4/5     Special Tests    Special Tests Lumbar;Sacrolliac Tests   Lumbar Tests Prone Knee Bend Test;Straight Leg Raise   Sacroiliac Tests  Sacral Compression  positive     Prone Knee Bend Test   Findings Positive   Side Right  and left   Comment Positive on both sides     Straight Leg Raise   Findings Positive   Side  Right   Comment Positive on right and left side     Sacral Compression   Findings Positive     Transfers   Comments 30 second sit to stand 6 times using bilateral UE's for push off.   Pt with increased SOB when completed     Balance   Balance Assessed --  SLS R: 16 seconds     SLS  L: 22                           PT Education - 10/11/15 1443    Education provided Yes   Education Details Pt was edu in home stretches. Pt was also educated in possible Dry Needling procedure for his back pain.    Person(s) Educated Patient   Methods Explanation;Demonstration;Verbal cues;Handout;Tactile cues   Comprehension Verbalized understanding;Returned demonstration;Verbal cues required;Tactile cues required          PT Short Term Goals - 10/11/15 1600      PT SHORT TERM GOAL #1   Title Pt will be independent with his HEP.    Baseline Issued new HEP today.    Time 4   Period Weeks   Status New     PT SHORT TERM GOAL #2   Title Pt will report decreased pain from 8/10 daily to </= 5/10 with daily activities.    Baseline 8/10 pain wiht ADL's most days per pt.    Time 4   Period Weeks   Status New  PT Long Term Goals - 10/11/15 1602      PT LONG TERM GOAL #1   Title Pt will increase bilateral LE strength to 5/5 in hips and knees in order to improve  functional mobility.    Baseline grossly 4-/5   Time 8   Period Weeks   Status New     PT LONG TERM GOAL #2   Title Pt will improve his FOTO score from 68% limitation to </= 58% limitiation in order to improve function.    Baseline 68% limitation   Time 8   Period Weeks   Status New     PT LONG TERM GOAL #3   Title Pt will improve lumbar flexion >60 degrees in order to complete daily activities.    Baseline 45 degrees   Time 8   Period Weeks   Status New               Plan - 10/11/15 1450    Clinical Impression Statement Pt is a 60 year old male who reports he is unable to drive and relys on public transportation for his appointments. Pt presents to Physical Therapy  c/o 8/10 low back pain. Pt with h/o of scoliosis, DJD, HTN, CAD, CABG, and COPD. Pt reports that this LBP has been going on for months. Pt reports some days are better than others. Pt reporting difficulty sleeping, sitting, walking, and standing. Pt did report lying on his stomach seems to help a little. Pt was issued prone on elbow stretch and posterior pelvic tilt. Pt presenting with decreased lumbar mobility. Will follow for stretching, functional mobilty, and decreased pain.    Rehab Potential Good   Clinical Impairments Affecting Rehab Potential CAD, COPD, HTN, DJD   PT Frequency 2x / week   PT Duration 8 weeks   PT Treatment/Interventions ADLs/Self Care Home Management;Electrical Stimulation;Iontophoresis 4mg /ml Dexamethasone;Functional mobility training;Gait training;Ultrasound;Traction;Moist Heat;Therapeutic activities;Therapeutic exercise;Balance training;Neuromuscular re-education;Patient/family education;Passive range of motion;Energy conservation;Dry needling;Taping;Manual techniques;Cryotherapy   PT Next Visit Plan Discuss Dry Needling further, Lumbar stretching and strengthening to pt tolerates   PT Home Exercise Plan prone on elbows, pelvic tilts   Consulted and Agree with Plan of Care Patient       Patient will benefit from skilled therapeutic intervention in order to improve the following deficits and impairments:  Abnormal gait, Decreased range of motion, Difficulty walking, Cardiopulmonary status limiting activity, Decreased activity tolerance, Impaired perceived functional ability, Pain, Improper body mechanics, Decreased mobility, Decreased strength, Postural dysfunction  Visit Diagnosis: Bilateral low back pain with left-sided sciatica  Bilateral low back pain with right-sided sciatica  Other abnormalities of gait and mobility     Problem List Patient Active Problem List   Diagnosis Date Noted  . Chronic lower back pain 08/17/2015  . Allergic rhinitis 08/09/2015  . Unprotected sex 05/07/2015  . Healthcare maintenance 02/24/2015  . Heart failure with preserved ejection fraction ([place]) 02/24/2015  . Disturbance of skin sensation 03/02/2014  . RLS (restless legs syndrome) 03/02/2014  . Hyperlipidemia 02/19/2014  . CAD- s/p CABG ? 5 yrs ago 02/16/2014  . HTN (hypertension) 02/16/2014  . Atypical chest pain-pleuritic chest pain 02/16/2014  . Tobacco use disorder 02/16/2014  . GERD (gastroesophageal reflux disease) 08/15/2010  . Chronic obstructive pulmonary disease ([place]) 02/15/2010    Oretha Caprice 10/11/2015, 4:17 PM  [facility] [address] [place], [place], [address] Phone: [phone]   Fax:  [phone]  Name: Adoniah Blumenschein MRN: [id number] Date of Birth:  [birth year]  Kearney Hard, PT 10/11/15 4:17 PM

## 2015-10-19 ENCOUNTER — Ambulatory Visit: Payer: Medicare Other | Admitting: Physical Therapy

## 2015-10-19 DIAGNOSIS — R2689 Other abnormalities of gait and mobility: Secondary | ICD-10-CM | POA: Diagnosis not present

## 2015-10-19 DIAGNOSIS — M5441 Lumbago with sciatica, right side: Secondary | ICD-10-CM

## 2015-10-19 DIAGNOSIS — M5442 Lumbago with sciatica, left side: Secondary | ICD-10-CM

## 2015-10-19 NOTE — Therapy (Signed)
[place], [place], [address] Phone: [phone]   Fax:  [phone]  Physical Therapy Treatment  Patient Details  Name: Gregory Pena MRN: [id number] Date of Birth: Dec 25, 1955 Referring Provider: Dellia Nims, MD  Encounter Date: 10/19/2015      PT End of Session - 10/19/15 1631    Visit Number 2   Number of Visits 16   Date for PT Re-Evaluation 12/11/15   PT Start Time [id number]   PT Stop Time 1640   PT Time Calculation (min) 55 min   Activity Tolerance Patient tolerated treatment well   Behavior During Therapy [facility] for tasks assessed/performed      Past Medical History:  Diagnosis Date  . Anxiety   . CHF (congestive heart failure) ([place])   . COPD (chronic obstructive pulmonary disease) ([place])   . Coronary artery disease   . 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 GERD (gastroesophageal reflux disease)   . Hypertension   . Medical non-compliance   . Myocardial infarction ([place])   . Seizures ([place])   . Shortness of breath dyspnea   . Stroke [facility])     Past Surgical History:  Procedure Laterality Date  . CORONARY ARTERY BYPASS GRAFT    . MOUTH SURGERY     jaw wiring   . open heart surgery      There were no vitals filed for this visit.      Subjective Assessment - 10/19/15 1553    Subjective "been consistent with the exercises when the pain isn't bothering him too much"    Currently in Pain? Yes   Pain Score 7    Pain Location Back   Pain Orientation Right;Left;Lower   Pain Onset More than a month ago   Pain Frequency Intermittent   Aggravating Factors  walking, household chores, prolong laying down   Pain Relieving Factors pain meds changing positions,                          [facility] Adult PT Treatment/Exercise - 10/19/15 0001      Manual Therapy   Manual Therapy Joint mobilization;Soft tissue mobilization;Myofascial release   Joint Mobilization Grade 1-2 L1-L5   for  pain relief    Soft tissue mobilization IASTM along bil lumbar paraspinals   Myofascial Release gentle myofascial stretching over bil lumbar paraspinals                PT Education - 10/19/15 1629    Education provided Yes   Education Details anatomy and biomechanics of discs and effects with trunk flexion/ extension. formation of trigger points and benefits of dry needling, what to expect and after care.    Person(s) Educated Patient   Methods Explanation;Verbal cues   Comprehension Verbalized understanding;Verbal cues required          PT Short Term Goals - 10/19/15 1633      PT SHORT TERM GOAL #1   Title Pt will be independent with his HEP.    Time 4   Period Weeks   Status On-going     PT SHORT TERM GOAL #2   Title Pt will report decreased pain from 8/10 daily to </= 5/10 with daily activities.    Time 4   Period Weeks   Status On-going           PT Long Term Goals - 10/19/15 1634      PT LONG TERM GOAL #  1   Time --               Plan - 10/19/15 1631    Clinical Impression Statement Mr. Okelley reports mild relief of pain since the last session. DN was performed on bil Lumbar multifuds at L3, following DN soft tissue work was performed while pt was propped on pillows for sustained extension. pt reported some centralization. utilized MHP post session to calm down soreness from DN.    PT Next Visit Plan assess response to DN,  Lumbar stretching and strengthening to pt tolerates, continue extension   Consulted and Agree with Plan of Care Patient      Patient will benefit from skilled therapeutic intervention in order to improve the following deficits and impairments:  Abnormal gait, Decreased range of motion, Difficulty walking, Cardiopulmonary status limiting activity, Decreased activity tolerance, Impaired perceived functional ability, Pain, Improper body mechanics, Decreased mobility, Decreased strength, Postural dysfunction  Visit  Diagnosis: Bilateral low back pain with left-sided sciatica  Bilateral low back pain with right-sided sciatica  Other abnormalities of gait and mobility     Problem List Patient Active Problem List   Diagnosis Date Noted  . Chronic lower back pain 08/17/2015  . Allergic rhinitis 08/09/2015  . Unprotected sex 05/07/2015  . Healthcare maintenance 02/24/2015  . Heart failure with preserved ejection fraction ([place]) 02/24/2015  . Disturbance of skin sensation 03/02/2014  . RLS (restless legs syndrome) 03/02/2014  . Hyperlipidemia 02/19/2014  . CAD- s/p CABG ? 5 yrs ago 02/16/2014  . HTN (hypertension) 02/16/2014  . Atypical chest pain-pleuritic chest pain 02/16/2014  . Tobacco use disorder 02/16/2014  . GERD (gastroesophageal reflux disease) 08/15/2010  . Chronic obstructive pulmonary disease ([place]) 02/15/2010   Starr Lake PT, DPT, LAT, ATC  10/19/15  4:51 PM      [facility] [place] [address] [place], [place], [address] Phone: [phone]   Fax:  [phone]  Name: Audwin Molitoris MRN: [id number] Date of Birth: 19-Mar-1955

## 2015-10-21 ENCOUNTER — Ambulatory Visit: Payer: Medicare Other | Admitting: Physical Therapy

## 2015-10-21 DIAGNOSIS — M5441 Lumbago with sciatica, right side: Secondary | ICD-10-CM | POA: Diagnosis not present

## 2015-10-21 DIAGNOSIS — M5442 Lumbago with sciatica, left side: Secondary | ICD-10-CM | POA: Diagnosis not present

## 2015-10-21 DIAGNOSIS — R2689 Other abnormalities of gait and mobility: Secondary | ICD-10-CM

## 2015-10-21 NOTE — Therapy (Signed)
[place], [place], [address] Phone: [phone]   Fax:  [phone]  Physical Therapy Treatment  Patient Details  Name: Gregory Pena MRN: [id number] Date of Birth: [birth year] Referring Provider: Dellia Nims, MD  Encounter Date: 10/21/2015      PT End of Session - 10/21/15 1431    Visit Number 3   Number of Visits 16   Date for PT Re-Evaluation 12/11/15   PT Start Time [id number]   PT Stop Time 1502   PT Time Calculation (min) 45 min   Activity Tolerance Patient tolerated treatment well   Behavior During Therapy [facility] for tasks assessed/performed      Past Medical History:  Diagnosis Date  . Anxiety   . CHF (congestive heart failure) ([place])   . COPD (chronic obstructive pulmonary disease) ([place])   . Coronary artery disease   . 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 GERD (gastroesophageal reflux disease)   . Hypertension   . Medical non-compliance   . Myocardial infarction ([place])   . Seizures ([place])   . Shortness of breath dyspnea   . Stroke [facility])     Past Surgical History:  Procedure Laterality Date  . CORONARY ARTERY BYPASS GRAFT    . MOUTH SURGERY     jaw wiring   . open heart surgery      There were no vitals filed for this visit.      Subjective Assessment - 10/21/15 1417    Subjective "feeling more sore today, felt better following the DN the other day" The pain seems to keep coming back.   Currently in Pain? Yes   Pain Score 8    Pain Location Back   Pain Orientation Right;Left;Lower   Pain Onset More than a month ago   Pain Frequency Intermittent                         [facility] Adult PT Treatment/Exercise - 10/21/15 0001      Modalities   Modalities Electrical Stimulation;Moist Heat     Moist Heat Therapy   Number Minutes Moist Heat 15 Minutes   Moist Heat Location Lumbar Spine  in prone with pt propped up on pillows     Electrical Stimulation   Electrical  Stimulation Location Lumbar spine   Electrical Stimulation Action IFC   Electrical Stimulation Parameters L19 scan 100%, x 15 min   Electrical Stimulation Goals Pain     Manual Therapy   Manual Therapy Taping   Manual therapy comments manual trigger point release x 3 along L L4 paraspinals  using gradual increasing pressure    Joint Mobilization Grade 1-2 L1-L5   with pt in prone   Soft tissue mobilization IASTM along bil lumbar paraspinals   Myofascial Release gentle myofascial stretching over bil lumbar paraspinals   Kinesiotex Inhibit Muscle     Kinesiotix   Inhibit Muscle  bil paraspinals to thoracic region                PT Education - 10/21/15 1436    Education provided Yes   Education Details bene[place]is for use of electrical stimulation to reduce pain and tightness   Person(s) Educated Patient   Methods Explanation;Verbal cues   Comprehension Verbalized understanding;Verbal cues required          PT Short Term Goals - 10/19/15 1633      PT SHORT TERM GOAL #1   Title Pt will  be independent with his HEP.    Time 4   Period Weeks   Status On-going     PT SHORT TERM GOAL #2   Title Pt will report decreased pain from 8/10 daily to </= 5/10 with daily activities.    Time 4   Period Weeks   Status On-going           PT Long Term Goals - 10/19/15 1634      PT LONG TERM GOAL #1   Time --               Plan - 10/21/15 1431    Clinical Impression Statement pt reported relief with treatment last session but has increased soreness in the low back today reported at 8/10. Focused todays session on pain relief and soft tissue work to relieve muscle tension and promote trunk mobility wihle pt is prone to work on [place] deficit.    PT Next Visit Plan assess response to MHP and e-stim,  Lumbar stretching and strengthening to pt tolerates, continue extension, modalities PRN   Consulted and Agree with Plan of Care Patient      Patient will benefit  from skilled therapeutic intervention in order to improve the following deficits and impairments:  Abnormal gait, Decreased range of motion, Difficulty walking, Cardiopulmonary status limiting activity, Decreased activity tolerance, Impaired perceived functional ability, Pain, Improper body mechanics, Decreased mobility, Decreased strength, Postural dysfunction  Visit Diagnosis: Bilateral low back pain with left-sided sciatica  Bilateral low back pain with right-sided sciatica  Other abnormalities of gait and mobility     Problem List Patient Active Problem List   Diagnosis Date Noted  . Chronic lower back pain 08/17/2015  . Allergic rhinitis 08/09/2015  . Unprotected sex 05/07/2015  . Healthcare maintenance 02/24/2015  . Heart failure with preserved ejection fraction ([place]) 02/24/2015  . Disturbance of skin sensation 03/02/2014  . RLS (restless legs syndrome) 03/02/2014  . Hyperlipidemia 02/19/2014  . CAD- s/p CABG ? 5 yrs ago 02/16/2014  . HTN (hypertension) 02/16/2014  . Atypical chest pain-pleuritic chest pain 02/16/2014  . Tobacco use disorder 02/16/2014  . GERD (gastroesophageal reflux disease) 08/15/2010  . Chronic obstructive pulmonary disease ([place]) 02/15/2010   Starr Lake PT, DPT, LAT, ATC  10/21/15  3:04 PM      [facility] [address] [place], [place], [address] Phone: [phone]   Fax:  [phone]  Name: Gregory Pena MRN: [id number] Date of Birth: 18-Sep-1955

## 2015-10-26 ENCOUNTER — Ambulatory Visit: Payer: Medicare Other | Admitting: Physical Therapy

## 2015-10-28 ENCOUNTER — Ambulatory Visit: Payer: Medicare Other | Attending: Internal Medicine | Admitting: Physical Therapy

## 2015-10-28 DIAGNOSIS — M5442 Lumbago with sciatica, left side: Secondary | ICD-10-CM | POA: Insufficient documentation

## 2015-10-28 DIAGNOSIS — M5441 Lumbago with sciatica, right side: Secondary | ICD-10-CM | POA: Insufficient documentation

## 2015-10-28 DIAGNOSIS — R2689 Other abnormalities of gait and mobility: Secondary | ICD-10-CM | POA: Insufficient documentation

## 2015-11-02 ENCOUNTER — Ambulatory Visit: Payer: Medicare Other | Admitting: Physical Therapy

## 2015-11-02 DIAGNOSIS — M5442 Lumbago with sciatica, left side: Secondary | ICD-10-CM

## 2015-11-02 DIAGNOSIS — M5441 Lumbago with sciatica, right side: Secondary | ICD-10-CM | POA: Diagnosis not present

## 2015-11-02 DIAGNOSIS — R2689 Other abnormalities of gait and mobility: Secondary | ICD-10-CM

## 2015-11-02 NOTE — Therapy (Signed)
Gregory Pena, [place], [address] Phone: [phone]   Fax:  [phone]  Physical Therapy Treatment  Patient Details  Name: Gregory Pena MRN: [id number] Date of Birth: 05/03/1955 Referring Provider: Dellia Nims, MD  Encounter Date: 11/02/2015      PT End of Session - 11/02/15 1547    Visit Number 4   Number of Visits 16   Date for PT Re-Evaluation 12/11/15   PT Start Time 1502   PT Stop Time 1552   PT Time Calculation (min) 50 min   Activity Tolerance Patient tolerated treatment well   Behavior During Therapy [facility] for tasks assessed/performed      Past Medical History:  Diagnosis Date  . Anxiety   . CHF (congestive heart failure) ([place])   . COPD (chronic obstructive pulmonary disease) ([place])   . Coronary artery disease   . 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 GERD (gastroesophageal reflux disease)   . Hypertension   . Medical non-compliance   . Myocardial infarction ([place])   . Seizures ([place])   . Shortness of breath dyspnea   . Stroke [facility])     Past Surgical History:  Procedure Laterality Date  . CORONARY ARTERY BYPASS GRAFT    . MOUTH SURGERY     jaw wiring   . open heart surgery      There were no vitals filed for this visit.      Subjective Assessment - 11/02/15 1508    Subjective "transportation missed me up the last couple of times, back is still having some pain, the muscles are still doing better the tape seemed to help"   Currently in Pain? Yes   Pain Score 6    Pain Location Back   Pain Orientation Right;Left;Lower   Pain Descriptors / Indicators Aching;Tightness   Pain Type Chronic pain   Pain Onset More than a month ago   Pain Frequency Intermittent   Aggravating Factors  walking, prolonged laying down   Pain Relieving Factors pain, meds changing positions                         [facility] Adult PT Treatment/Exercise - 11/02/15 0001      Moist Heat Therapy    Number Minutes Moist Heat 10 Minutes   Moist Heat Location Lumbar Spine  pt in supine     Manual Therapy   Manual Therapy Manual Traction   Joint Mobilization Grade 1-2 L1-L5    Soft tissue mobilization IASTM along bil lumbar paraspinals   Myofascial Release gentle myofascial stretching over bil lumbar paraspinals   Manual Traction 8 x 30 sec hold    Kinesiotex Inhibit Muscle          Trigger Point Dry Needling - 11/02/15 1518    Consent Given? Yes   Education Handout Provided Yes  given previously   Muscles Treated Upper Body Longissimus   Longissimus Response Twitch response elicited;Palpable increased muscle length  multifidus L2-L3 bil with pistoning/ twisting              PT Education - 11/02/15 1546    Education provided Yes   Education Details pt may benefit from traction to promote improved space around the nerves which   Person(s) Educated Patient   Methods Explanation;Verbal cues;Demonstration  performed manual tractoin   Comprehension Verbalized understanding;Verbal cues required          PT Short Term Goals - 10/19/15 1633  PT SHORT TERM GOAL #1   Title Pt will be independent with his HEP.    Time 4   Period Weeks   Status On-going     PT SHORT TERM GOAL #2   Title Pt will report decreased pain from 8/10 daily to </= 5/10 with daily activities.    Time 4   Period Weeks   Status On-going           PT Long Term Goals - 10/19/15 1634      PT LONG TERM GOAL #1   Time --               Plan - 11/02/15 1548    Clinical Impression Statement Mr. Riesgo reported relief of pain with the KT taping since last session. DN was performed on the low back at L L 3-L4. following soft tissue work and mobilizations, reapplied KT tape and performed manual traction which pt reported some relief of pain and may beneift from mechanical traction but plan to conitnue with manual traction to continue assessing benefit before mechanical traction  trial.    PT Next Visit Plan manual traction and TPDN, ,  Lumbar stretching and strengthening to pt tolerates, continue extension, modalities PRN   Consulted and Agree with Plan of Care Patient      Patient will benefit from skilled therapeutic intervention in order to improve the following deficits and impairments:  Abnormal gait, Decreased range of motion, Difficulty walking, Cardiopulmonary status limiting activity, Decreased activity tolerance, Impaired perceived functional ability, Pain, Improper body mechanics, Decreased mobility, Decreased strength, Postural dysfunction  Visit Diagnosis: Bilateral low back pain with left-sided sciatica  Bilateral low back pain with right-sided sciatica  Other abnormalities of gait and mobility     Problem List Patient Active Problem List   Diagnosis Date Noted  . Chronic lower back pain 08/17/2015  . Allergic rhinitis 08/09/2015  . Unprotected sex 05/07/2015  . Healthcare maintenance 02/24/2015  . Heart failure with preserved ejection fraction ([place]) 02/24/2015  . Disturbance of skin sensation 03/02/2014  . RLS (restless legs syndrome) 03/02/2014  . Hyperlipidemia 02/19/2014  . CAD- s/p CABG ? 5 yrs ago 02/16/2014  . HTN (hypertension) 02/16/2014  . Atypical chest pain-pleuritic chest pain 02/16/2014  . Tobacco use disorder 02/16/2014  . GERD (gastroesophageal reflux disease) 08/15/2010  . Chronic obstructive pulmonary disease ([place]) 02/15/2010   Starr Lake PT, DPT, LAT, ATC  11/02/15  3:51 PM      [place] [facility] [address] [place], [place], [address] Phone: [phone]   Fax:  [phone]  Name: Gregory Pena MRN: [id number] Date of Birth: Jul 18, 1955

## 2015-11-04 ENCOUNTER — Ambulatory Visit: Payer: Medicare Other | Admitting: Physical Therapy

## 2015-11-04 DIAGNOSIS — R2689 Other abnormalities of gait and mobility: Secondary | ICD-10-CM

## 2015-11-04 DIAGNOSIS — M5442 Lumbago with sciatica, left side: Secondary | ICD-10-CM | POA: Diagnosis not present

## 2015-11-04 DIAGNOSIS — M5441 Lumbago with sciatica, right side: Secondary | ICD-10-CM | POA: Diagnosis not present

## 2015-11-04 NOTE — Therapy (Addendum)
[place], [place], [address] Phone: [phone]   Fax:  [phone]  Physical Therapy Treatment/ Discharge Note  Patient Details  Name: Gregory Pena MRN: [id number] Date of Birth: 1955/07/23 Referring Provider: Dellia Nims, MD  Encounter Date: 11/04/2015      PT End of Session - 11/04/15 1431    Visit Number 5   Number of Visits 16   Date for PT Re-Evaluation 12/11/15   PT Start Time 2694  pt arrived 13 minutes and is conitnuing to have trouble with transportation   PT Stop Time 1506   PT Time Calculation (min) 38 min   Activity Tolerance Patient tolerated treatment well   Behavior During Therapy [facility] for tasks assessed/performed      Past Medical History:  Diagnosis Date  . Anxiety   . CHF (congestive heart failure) ([place])   . COPD (chronic obstructive pulmonary disease) ([place])   . Coronary artery disease   . 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 GERD (gastroesophageal reflux disease)   . Hypertension   . Medical non-compliance   . Myocardial infarction ([place])   . Seizures ([place])   . Shortness of breath dyspnea   . Stroke [facility])     Past Surgical History:  Procedure Laterality Date  . CORONARY ARTERY BYPASS GRAFT    . MOUTH SURGERY     jaw wiring   . open heart surgery      There were no vitals filed for this visit.      Subjective Assessment - 11/04/15 1430    Subjective "pt reports having increased frustrations due to transportation due to running late, still some soreness btu the tape has helped"    Currently in Pain? Yes   Pain Score 6    Pain Location Back   Pain Orientation Right;Left;Lower   Pain Onset More than a month ago   Pain Frequency Intermittent                         [facility] Adult PT Treatment/Exercise - 11/04/15 0001      Lumbar Exercises: Stretches   Passive Hamstring Stretch 3 reps;30 seconds   Single Knee to Chest Stretch 3 reps;30 seconds   Lower Trunk Rotation 3 reps;30 seconds   Pelvic Tilt Other (comment)  5 sec hold x 2 sets   Press Ups 5 reps;30 seconds  x 2 sets     Moist Heat Therapy   Number Minutes Moist Heat 15 Minutes   Moist Heat Location Lumbar Spine  performed exercises while lying  on MHP      Manual Therapy   Manual Therapy Neural Stretch   Soft tissue mobilization IASTM along bil lumbar paraspinals   Manual Traction 10 x 30 sec hold   1 set before treatment, 1 set following treatment   Neural Stretch bil hamstring stretch with ankle pumping 2 x 10 performed  bil                  PT Short Term Goals - 10/19/15 1633      PT SHORT TERM GOAL #1   Title Pt will be independent with his HEP.    Time 4   Period Weeks   Status On-going     PT SHORT TERM GOAL #2   Title Pt will report decreased pain from 8/10 daily to </= 5/10 with daily activities.    Time 4   Period Weeks  Status On-going           PT Long Term Goals - 10/19/15 1634      PT LONG TERM GOAL #1   Time --        G-Code:  Mobility: walking and moving around Clinical judement Goal status: CJ Discharge Status: CK        Plan - 11/04/15 1459    Clinical Impression Statement [name] continues to having issues with transportation which seems to increase stress causing pain inthe back. pt reported pain at 6/10 today before treatment. focused on core strengthening/ hip stretching and manual traction which he reported decreased pain to 4/10. plan to attempted mechanical traction next session and progress slowly.    PT Next Visit Plan mechanical traction start slowly,  Lumbar stretching and strengthening to pt tolerates, continue extension, modalities PRN   Consulted and Agree with Plan of Care Patient      Patient will benefit from skilled therapeutic intervention in order to improve the following deficits and impairments:     Visit Diagnosis: Bilateral low back pain with left-sided sciatica  Bilateral low  back pain with right-sided sciatica  Other abnormalities of gait and mobility     Problem List Patient Active Problem List   Diagnosis Date Noted  . Chronic lower back pain 08/17/2015  . Allergic rhinitis 08/09/2015  . Unprotected sex 05/07/2015  . Healthcare maintenance 02/24/2015  . Heart failure with preserved ejection fraction ([place]) 02/24/2015  . Disturbance of skin sensation 03/02/2014  . RLS (restless legs syndrome) 03/02/2014  . Hyperlipidemia 02/19/2014  . CAD- s/p CABG ? 5 yrs ago 02/16/2014  . HTN (hypertension) 02/16/2014  . Atypical chest pain-pleuritic chest pain 02/16/2014  . Tobacco use disorder 02/16/2014  . GERD (gastroesophageal reflux disease) 08/15/2010  . Chronic obstructive pulmonary disease ([place]) 02/15/2010   Starr Lake PT, DPT, LAT, ATC  11/04/15  3:07 PM      [facility] [address] [place], [place], [address] Phone: [phone]   Fax:  [phone]  Name: Gregory Pena MRN: [id number] Date of Birth: Jul 04, 1955   PHYSICAL THERAPY DISCHARGE SUMMARY  Visits from Start of Care: 5  Current functional level related to goals / functional outcomes: See goals   Remaining deficits: unknown   Education / Equipment: HEP, posture education  Plan:                                                    Patient goals were not met. Patient is being discharged due to not returning since the last visit.  ?????    Kristoffer Leamon PT, DPT, LAT, ATC  11/25/15  2:38 PM

## 2015-11-16 ENCOUNTER — Ambulatory Visit: Payer: Medicare Other | Admitting: Physical Therapy

## 2015-11-17 ENCOUNTER — Emergency Department (HOSPITAL_COMMUNITY): Payer: Medicare Other

## 2015-11-17 ENCOUNTER — Encounter (HOSPITAL_COMMUNITY): Payer: Self-pay | Admitting: Emergency Medicine

## 2015-11-17 ENCOUNTER — Emergency Department (HOSPITAL_COMMUNITY)
Admission: EM | Admit: 2015-11-17 | Discharge: 2015-11-17 | Disposition: A | Discharge status: Home / Self Care | Payer: Medicare Other | Attending: Emergency Medicine | Admitting: Emergency Medicine

## 2015-11-17 DIAGNOSIS — I2510 Atherosclerotic heart disease of native coronary artery without angina pectoris: Secondary | ICD-10-CM | POA: Insufficient documentation

## 2015-11-17 DIAGNOSIS — Z8673 Personal history of transient ischemic attack (TIA), and cerebral infarction without residual deficits: Secondary | ICD-10-CM | POA: Insufficient documentation

## 2015-11-17 DIAGNOSIS — I110 Hypertensive heart disease with heart failure: Secondary | ICD-10-CM | POA: Diagnosis not present

## 2015-11-17 DIAGNOSIS — Z7982 Long term (current) use of aspirin: Secondary | ICD-10-CM | POA: Insufficient documentation

## 2015-11-17 DIAGNOSIS — I509 Heart failure, unspecified: Secondary | ICD-10-CM | POA: Insufficient documentation

## 2015-11-17 DIAGNOSIS — I252 Old myocardial infarction: Secondary | ICD-10-CM | POA: Diagnosis not present

## 2015-11-17 DIAGNOSIS — R079 Chest pain, unspecified: Secondary | ICD-10-CM | POA: Diagnosis not present

## 2015-11-17 DIAGNOSIS — Z951 Presence of aortocoronary bypass graft: Secondary | ICD-10-CM | POA: Diagnosis not present

## 2015-11-17 DIAGNOSIS — F17210 Nicotine dependence, cigarettes, uncomplicated: Secondary | ICD-10-CM | POA: Diagnosis not present

## 2015-11-17 DIAGNOSIS — J441 Chronic obstructive pulmonary disease with (acute) exacerbation: Secondary | ICD-10-CM

## 2015-11-17 DIAGNOSIS — Z79899 Other long term (current) drug therapy: Secondary | ICD-10-CM | POA: Insufficient documentation

## 2015-11-17 DIAGNOSIS — R0789 Other chest pain: Secondary | ICD-10-CM | POA: Diagnosis present

## 2015-11-17 LAB — BASIC METABOLIC PANEL WITH GFR
Anion gap: 16 — ABNORMAL HIGH (ref 5–15)
BUN: 12 mg/dL (ref 6–20)
CO2: 21 mmol/L — ABNORMAL LOW (ref 22–32)
Calcium: 10.1 mg/dL (ref 8.9–10.3)
Chloride: 99 mmol/L — ABNORMAL LOW (ref 101–111)
Creatinine, Ser: 1.03 mg/dL (ref 0.61–1.24)
GFR calc Af Amer: >60 mL/min
GFR calc non Af Amer: >60 mL/min
Glucose, Bld: 96 mg/dL (ref 65–99)
Potassium: 3.7 mmol/L (ref 3.5–5.1)
Sodium: 136 mmol/L (ref 135–145)

## 2015-11-17 LAB — CBC
HCT: 49 % (ref 39.0–52.0)
Hemoglobin: 16.3 g/dL (ref 13.0–17.0)
MCH: 31.3 pg (ref 26.0–34.0)
MCHC: 33.3 g/dL (ref 30.0–36.0)
MCV: 94 fL (ref 78.0–100.0)
Platelets: 370 K/uL (ref 150–400)
RBC: 5.21 MIL/uL (ref 4.22–5.81)
RDW: 14.6 % (ref 11.5–15.5)
WBC: 10.2 K/uL (ref 4.0–10.5)

## 2015-11-17 LAB — I-STAT TROPONIN, ED: Troponin i, poc: 0 ng/mL (ref 0.00–0.08)

## 2015-11-17 MED ORDER — GABAPENTIN 300 MG PO CAPS
300.0000 mg | ORAL_CAPSULE | Freq: Once | ORAL | Status: AC
  Administered 2015-11-17: 300 mg via ORAL
  Filled 2015-11-17: qty 1

## 2015-11-17 MED ORDER — ALBUTEROL SULFATE HFA 108 (90 BASE) MCG/ACT IN AERS: 2.0000 | INHALATION_SPRAY | RESPIRATORY_TRACT | 0 refills | Status: AC | PRN

## 2015-11-17 MED ORDER — PREDNISONE 20 MG PO TABS
60.0000 mg | ORAL_TABLET | Freq: Once | ORAL | Status: AC
  Administered 2015-11-17: 60 mg via ORAL
  Filled 2015-11-17: qty 3

## 2015-11-17 MED ORDER — IPRATROPIUM-ALBUTEROL 0.5-2.5 (3) MG/3ML IN SOLN
3.0000 mL | Freq: Once | RESPIRATORY_TRACT | Status: AC
  Administered 2015-11-17: 3 mL via RESPIRATORY_TRACT
  Filled 2015-11-17: qty 3

## 2015-11-17 MED ORDER — PREDNISONE 10 MG PO TABS: 60.0000 mg | ORAL_TABLET | Freq: Every day | ORAL | 0 refills | Status: AC

## 2015-11-17 MED ORDER — IBUPROFEN 400 MG PO TABS
600.0000 mg | ORAL_TABLET | Freq: Once | ORAL | Status: AC
  Administered 2015-11-17: 600 mg via ORAL
  Filled 2015-11-17: qty 1

## 2015-11-17 NOTE — ED Notes (Signed)
Patient transported to X-ray 

## 2015-11-17 NOTE — ED Provider Notes (Signed)
[facility] DEPT Provider Note   CSN: [id number] Arrival date & time: 11/17/15  0453     History   Chief Complaint Chief Complaint  Patient presents with  . Chest Pain    HPI Gregory Pena is a 60 y.o. male.  Patient reports productive cough with associated shortness of breath over the past 2 days.  He states that ongoing chest discomfort over the past 12 hours and is coughing a lot.  He reports pain is worse with coughing.  He does have a history of MI status post stent.  Last stent was in 2012 in [place].  States this pain in his chest feels different.  No fevers.  No unilateral leg swelling.  No history DVT or pulmonary embolism   The history is provided by the patient.      S/p mutiple PCIs RCA; CABGx1 SVG-RCA and subsequent PCI to SVG; PCI to D1 (09/2010)     Past Medical History:  Diagnosis Date  . Anxiety   . CHF (congestive heart failure) ([place])   . COPD (chronic obstructive pulmonary disease) ([place])   . Coronary artery disease   . 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 GERD (gastroesophageal reflux disease)   . Hypertension   . Medical non-compliance   . Myocardial infarction ([place])   . Seizures ([place])   . Shortness of breath dyspnea   . Stroke [facility])     Patient Active Problem List   Diagnosis Date Noted  . Chronic lower back pain 08/17/2015  . Allergic rhinitis 08/09/2015  . Unprotected sex 05/07/2015  . Healthcare maintenance 02/24/2015  . Heart failure with preserved ejection fraction ([place]) 02/24/2015  . Disturbance of skin sensation 03/02/2014  . RLS (restless legs syndrome) 03/02/2014  . Hyperlipidemia 02/19/2014  . CAD- s/p CABG ? 5 yrs ago 02/16/2014  . HTN (hypertension) 02/16/2014  . Atypical chest pain-pleuritic chest pain 02/16/2014  . Tobacco use disorder 02/16/2014  . GERD (gastroesophageal reflux disease) 08/15/2010  . Chronic obstructive pulmonary disease ([place]) 02/15/2010    Past Surgical History:  Procedure  Laterality Date  . CORONARY ARTERY BYPASS GRAFT    . MOUTH SURGERY     jaw wiring   . open heart surgery         Home Medications    Prior to Admission medications   Medication Sig Start Date End Date Taking? Authorizing Provider  albuterol (PROVENTIL HFA;VENTOLIN HFA) 108 (90 Base) MCG/ACT inhaler Inhale 2 puffs into the lungs every 6 (six) hours as needed for wheezing or shortness of breath. 05/22/15   Jola Schmidt, MD  albuterol (PROVENTIL) (2.5 MG/3ML) 0.083% nebulizer solution Take 3 mLs (2.5 mg total) by nebulization every 6 (six) hours as needed for wheezing or shortness of breath. 02/24/15   Iline Oven, MD  aspirin EC 81 MG EC tablet Take 1 tablet (81 mg total) by mouth daily. 02/19/14   Juliet Rude, MD  atorvastatin (LIPITOR) 40 MG tablet Take 1 tablet (40 mg total) by mouth daily at 6 PM. 02/24/15   Iline Oven, MD  budesonide-formoterol [facility]) 160-4.5 MCG/ACT inhaler Inhale 2 puffs into the lungs 2 (two) times daily. Patient not taking: Reported on 05/22/2015 05/21/15   Iline Oven, MD  Cetirizine HCl 10 MG CAPS Take 1 capsule (10 mg total) by mouth daily. 08/09/15   Iline Oven, MD  Cyanocobalamin (VITAMIN B 12 PO) Take 1 tablet by mouth daily.    Historical Provider, MD  gabapentin (NEURONTIN)  300 MG capsule Take 1 capsule (300 mg total) by mouth 3 (three) times daily. 08/09/15 08/08/16  Iline Oven, MD  lisinopril (PRINIVIL,ZESTRIL) 30 MG tablet Take 1 tablet (30 mg total) by mouth daily. 08/09/15   Iline Oven, MD  Menthol (COUGH DROPS) 10 MG LOZG Use as directed 1 lozenge in the mouth or throat as needed (for cough).    Historical Provider, MD  methocarbamol (ROBAXIN) 500 MG tablet Take 2 tablets (1,000 mg total) by mouth every 6 (six) hours as needed for muscle spasms. Patient not taking: Reported on 10/11/2015 08/17/15   Dellia Nims, MD  metoprolol tartrate (LOPRESSOR) 25 MG tablet Take 1 tablet (25 mg total) by mouth 2 (two) times daily.  08/09/15 08/08/16  Iline Oven, MD  nitroGLYCERIN (NITROSTAT) 0.4 MG SL tablet Place 1 tablet (0.4 mg total) under the tongue every 5 (five) minutes as needed for chest pain. 02/24/15   Iline Oven, MD  pantoprazole (PROTONIX) 40 MG tablet Take 1 tablet (40 mg total) by mouth daily. 05/07/15   Iline Oven, MD  predniSONE (DELTASONE) 10 MG tablet Take 6 tablets (60 mg total) by mouth daily. Patient not taking: Reported on 10/11/2015 05/22/15   Jola Schmidt, MD  tiotropium (SPIRIVA HANDIHALER) 18 MCG inhalation capsule Place 1 capsule (18 mcg total) into inhaler and inhale daily. 05/07/15 05/06/16  Iline Oven, MD  valACYclovir (VALTREX) 1000 MG tablet Take 1 tablet (1,000 mg total) by mouth 2 (two) times daily. 05/07/15   Iline Oven, MD    Family History Family History  Problem Relation Age of Onset  . Coronary artery disease Neg Hx     Social History Social History  Substance Use Topics  . Smoking status: Current Every Day Smoker    Packs/day: 0.00    Types: Cigarettes  . Smokeless tobacco: Former Systems developer  . Alcohol use 0.0 oz/week     Allergies   Dilantin [phenytoin] and Vicodin [hydrocodone-acetaminophen]   Review of Systems Review of Systems  All other systems reviewed and are negative.    Physical Exam Updated Vital Signs BP 130/88 (BP Location: Right Arm)   Pulse 103   Temp 98.2 F (36.8 C) (Oral)   Resp 16   SpO2 99%   Physical Exam  Constitutional: He is oriented to person, place, and time. He appears well-developed and well-nourished.  HENT:  Head: Normocephalic and atraumatic.  Eyes: EOM are normal.  Neck: Normal range of motion.  Cardiovascular: Normal rate, regular rhythm, normal heart sounds and intact distal pulses.   Pulmonary/Chest: Effort normal and breath sounds normal. No respiratory distress.  Abdominal: Soft. He exhibits no distension. There is no tenderness.  Musculoskeletal: Normal range of motion.  Neurological: He is  alert and oriented to person, place, and time.  Skin: Skin is warm and dry.  Psychiatric: He has a normal mood and affect. Judgment normal.  Nursing note and vitals reviewed.    ED Treatments / Results  Labs (all labs ordered are listed, but only abnormal results are displayed) Labs Reviewed  BASIC METABOLIC PANEL - Abnormal; Notable for the following:       Result Value   [place] 99 (*)    CO2 21 (*)    Anion gap 16 (*)    All other components within normal limits  CBC  I-STAT TROPOININ, ED    EKG  EKG Interpretation  Date/Time:  Wednesday November 17 2015 04:55:25 EDT Ventricular Rate:  104 PR Interval:  QRS Duration: 120 QT Interval:  349 QTC Calculation: 459 R Axis:   76 Text Interpretation:  Sinus tachycardia Probable left atrial enlargement Nonspecific intraventricular conduction delay Borderline T abnormalities, inferior leads No significant change was found Confirmed by Jheremy Boger  MD, Lennette Bihari ([id number]) on 11/17/2015 5:36:19 AM       Radiology Dg Chest 2 View  Result Date: 11/17/2015 CLINICAL DATA:  60 year old male with chest pain and shortness of breath EXAM: CHEST  2 VIEW COMPARISON:  Chest radiograph dated 05/21/2015 FINDINGS: Two views of the chest demonstrate mild emphysematous changes of the lungs with bibasilar linear atelectasis/ scarring. There is no focal consolidation, pleural effusion, or pneumothorax. The cardiac silhouette is within normal limits. Median sternotomy wires noted. No acute osseous pathology. IMPRESSION: No active cardiopulmonary disease. Electronically Signed   By: Anner Crete M.D.   On: 11/17/2015 06:14    Procedures Procedures (including critical care time)  Medications Ordered in ED Medications  gabapentin (NEURONTIN) capsule 300 mg (300 mg Oral Given 11/17/15 0606)  ibuprofen (ADVIL,MOTRIN) tablet 600 mg (600 mg Oral Given 11/17/15 0601)  predniSONE (DELTASONE) tablet 60 mg (60 mg Oral Given 11/17/15 0601)  ipratropium-albuterol  (DUONEB) 0.5-2.5 (3) MG/3ML nebulizer solution 3 mL (3 mLs Nebulization Given 11/17/15 0607)     Initial Impression / Assessment and Plan / ED Course  I have reviewed the triage vital signs and the nursing notes.  Pertinent labs & imaging results that were available during my care of the patient were reviewed by me and considered in my medical decision making (see chart for details).  Clinical Course    Patient feels better after treatment of his breathing the emergency department.  Chest pain resolved.  EKG without ischemic changes.  He has a chronic right bundle branch block with nonspecific ST changes likely related to the right bundle branch block.  Suspect COPD exacerbation.  Primary care follow-up.  Home with short course of steroids.  He understands to return to the ER for new or worsening symptoms  Final Clinical Impressions(s) / ED Diagnoses   Final diagnoses:  COPD exacerbation (HCC)    New Prescriptions New Prescriptions   ALBUTEROL (PROVENTIL HFA;VENTOLIN HFA) 108 (90 BASE) MCG/ACT INHALER    Inhale 2 puffs into the lungs every 4 (four) hours as needed for wheezing or shortness of breath.   PREDNISONE (DELTASONE) 10 MG TABLET    Take 6 tablets (60 mg total) by mouth daily.     Jola Schmidt, MD 11/17/15 [phone]

## 2015-11-17 NOTE — ED Triage Notes (Addendum)
[facility] ( with 2 jail guards) reports right chest pain with SOB , productive cough and chest congestion onset this morning , he received ASA 325 mg and 2 NTG sl with mild relief.

## 2015-11-18 ENCOUNTER — Ambulatory Visit: Payer: Medicare Other | Admitting: Physical Therapy

## 2015-11-23 ENCOUNTER — Ambulatory Visit: Payer: Medicare Other | Attending: Internal Medicine | Admitting: Physical Therapy

## 2015-11-25 ENCOUNTER — Telehealth: Payer: Self-pay | Admitting: Physical Therapy

## 2015-11-25 ENCOUNTER — Ambulatory Visit: Payer: Medicare Other | Admitting: Physical Therapy

## 2015-11-25 NOTE — Telephone Encounter (Signed)
LVM regarding pt missing the last  3 appointments and since he has had difficulty with transportation in the past to call back and let us know if it was due to transportation or if he felt he no longer needed physical therapy. If he no longer needed physical therapy we could discharge him, either way to call back.

## 2015-11-30 ENCOUNTER — Telehealth: Payer: Self-pay | Admitting: Internal Medicine

## 2015-11-30 ENCOUNTER — Encounter: Payer: Medicare Other | Admitting: Physical Therapy

## 2015-11-30 NOTE — Telephone Encounter (Signed)
APT. REMINDER CALL, LMTCB °

## 2015-12-01 ENCOUNTER — Encounter: Payer: Medicare Other | Admitting: Internal Medicine

## 2015-12-02 ENCOUNTER — Encounter: Payer: Medicare Other | Admitting: Physical Therapy

## 2015-12-04 DIAGNOSIS — J441 Chronic obstructive pulmonary disease with (acute) exacerbation: Secondary | ICD-10-CM | POA: Diagnosis not present

## 2015-12-04 DIAGNOSIS — Z79899 Other long term (current) drug therapy: Secondary | ICD-10-CM | POA: Diagnosis not present

## 2015-12-04 DIAGNOSIS — I10 Essential (primary) hypertension: Secondary | ICD-10-CM | POA: Diagnosis not present

## 2015-12-04 DIAGNOSIS — Z888 Allergy status to other drugs, medicaments and biological substances status: Secondary | ICD-10-CM | POA: Diagnosis not present

## 2015-12-04 DIAGNOSIS — Z886 Allergy status to analgesic agent status: Secondary | ICD-10-CM | POA: Diagnosis not present

## 2015-12-04 DIAGNOSIS — Z7982 Long term (current) use of aspirin: Secondary | ICD-10-CM | POA: Diagnosis not present

## 2015-12-04 DIAGNOSIS — I2510 Atherosclerotic heart disease of native coronary artery without angina pectoris: Secondary | ICD-10-CM | POA: Diagnosis not present

## 2015-12-04 DIAGNOSIS — Z8673 Personal history of transient ischemic attack (TIA), and cerebral infarction without residual deficits: Secondary | ICD-10-CM | POA: Diagnosis not present

## 2015-12-04 DIAGNOSIS — F17210 Nicotine dependence, cigarettes, uncomplicated: Secondary | ICD-10-CM | POA: Diagnosis not present

## 2015-12-04 DIAGNOSIS — F329 Major depressive disorder, single episode, unspecified: Secondary | ICD-10-CM | POA: Diagnosis not present

## 2015-12-07 ENCOUNTER — Encounter: Payer: Medicare Other | Admitting: Physical Therapy

## 2015-12-09 ENCOUNTER — Encounter: Payer: Medicare Other | Admitting: Physical Therapy

## 2015-12-12 DIAGNOSIS — J40 Bronchitis, not specified as acute or chronic: Secondary | ICD-10-CM | POA: Diagnosis not present

## 2015-12-12 DIAGNOSIS — Z885 Allergy status to narcotic agent status: Secondary | ICD-10-CM | POA: Diagnosis not present

## 2015-12-12 DIAGNOSIS — Z7982 Long term (current) use of aspirin: Secondary | ICD-10-CM | POA: Diagnosis not present

## 2015-12-12 DIAGNOSIS — Z8673 Personal history of transient ischemic attack (TIA), and cerebral infarction without residual deficits: Secondary | ICD-10-CM | POA: Diagnosis not present

## 2015-12-12 DIAGNOSIS — J439 Emphysema, unspecified: Secondary | ICD-10-CM | POA: Diagnosis not present

## 2015-12-12 DIAGNOSIS — Z888 Allergy status to other drugs, medicaments and biological substances status: Secondary | ICD-10-CM | POA: Diagnosis not present

## 2015-12-12 DIAGNOSIS — F17210 Nicotine dependence, cigarettes, uncomplicated: Secondary | ICD-10-CM | POA: Diagnosis not present

## 2015-12-12 DIAGNOSIS — B349 Viral infection, unspecified: Secondary | ICD-10-CM | POA: Diagnosis not present

## 2015-12-12 DIAGNOSIS — J069 Acute upper respiratory infection, unspecified: Secondary | ICD-10-CM | POA: Diagnosis not present

## 2015-12-12 DIAGNOSIS — Z79899 Other long term (current) drug therapy: Secondary | ICD-10-CM | POA: Diagnosis not present

## 2015-12-12 DIAGNOSIS — F329 Major depressive disorder, single episode, unspecified: Secondary | ICD-10-CM | POA: Diagnosis not present

## 2015-12-12 DIAGNOSIS — I10 Essential (primary) hypertension: Secondary | ICD-10-CM | POA: Diagnosis not present

## 2016-05-06 DIAGNOSIS — R100 Acute abdomen: Secondary | ICD-10-CM | POA: Diagnosis not present

## 2016-05-06 DIAGNOSIS — R042 Hemoptysis: Secondary | ICD-10-CM | POA: Diagnosis not present

## 2016-05-06 DIAGNOSIS — R4182 Altered mental status, unspecified: Secondary | ICD-10-CM | POA: Diagnosis not present

## 2016-08-14 DIAGNOSIS — F17210 Nicotine dependence, cigarettes, uncomplicated: Secondary | ICD-10-CM | POA: Diagnosis not present

## 2016-08-14 DIAGNOSIS — J40 Bronchitis, not specified as acute or chronic: Secondary | ICD-10-CM | POA: Diagnosis not present

## 2016-08-14 DIAGNOSIS — J441 Chronic obstructive pulmonary disease with (acute) exacerbation: Secondary | ICD-10-CM | POA: Diagnosis not present

## 2016-08-14 DIAGNOSIS — R0602 Shortness of breath: Secondary | ICD-10-CM | POA: Diagnosis not present

## 2016-08-14 DIAGNOSIS — I10 Essential (primary) hypertension: Secondary | ICD-10-CM | POA: Diagnosis not present

## 2016-10-04 IMAGING — CR DG CHEST 2V
2 series · 2 of 2 positions shown · non-contrast
Comparison: 02/16/2014

CLINICAL DATA: Evaluate for pneumonia. Shortness of breath, chest
pain, cough.

EXAM:
CHEST  2 VIEW

[chest pa]
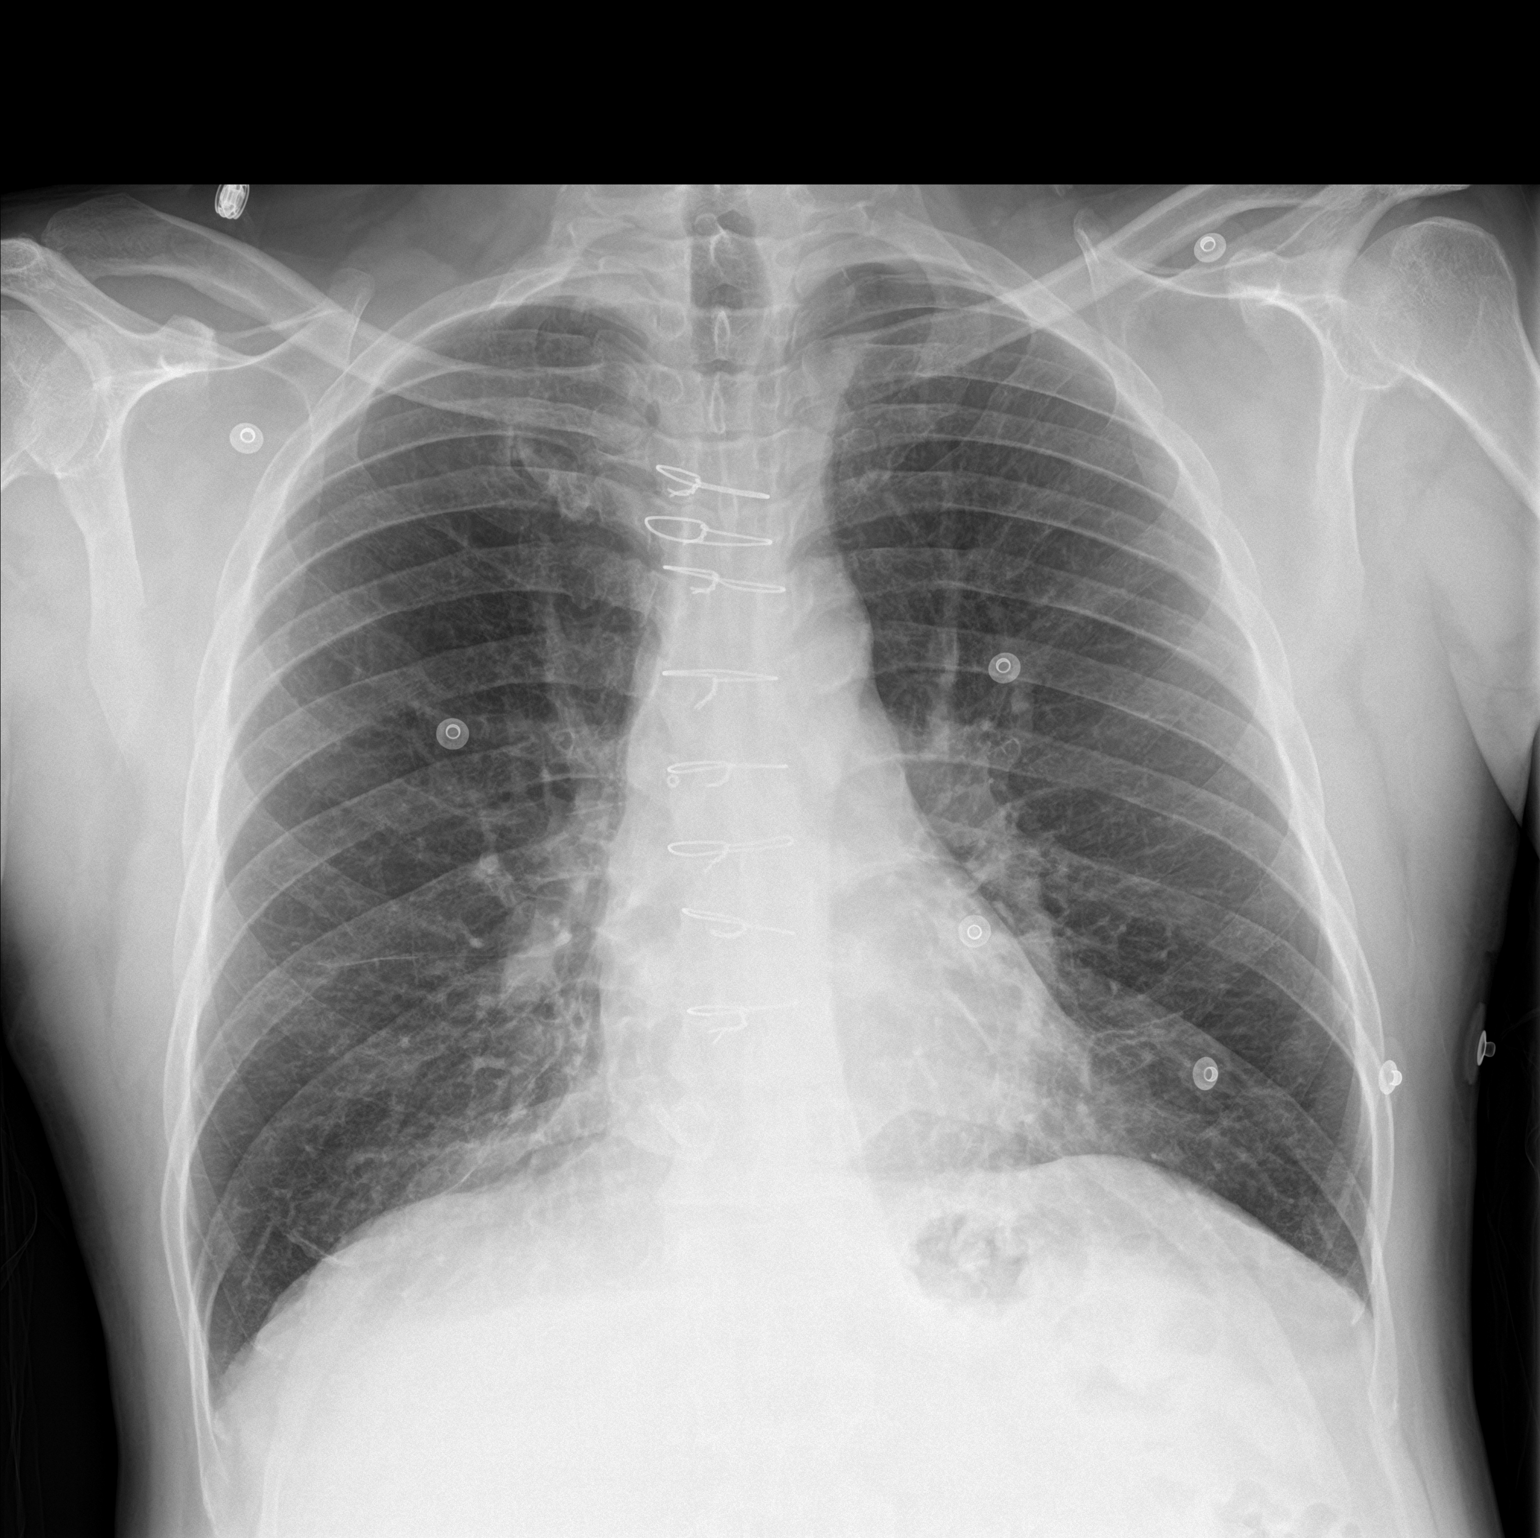

[chest lat]
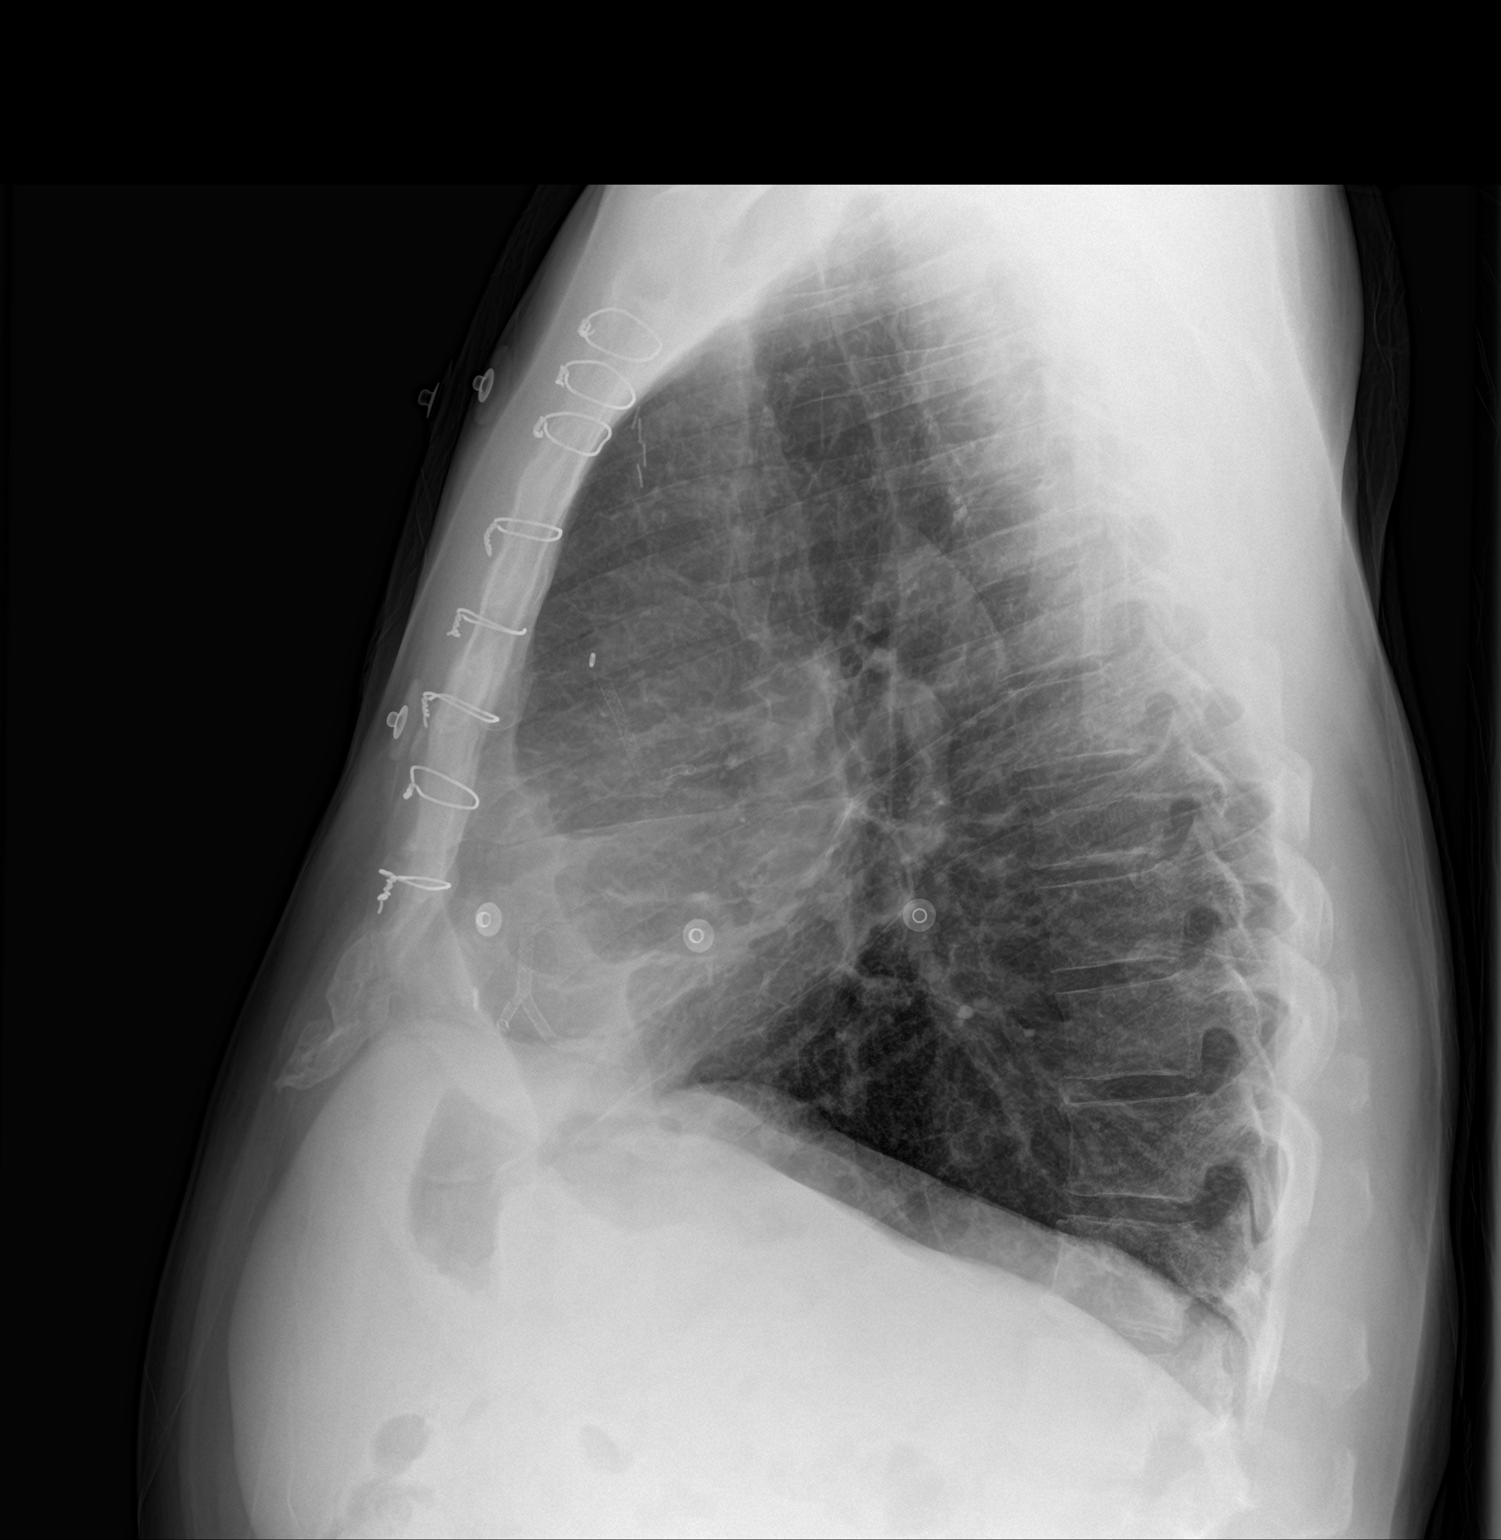

[2 of 2 positions shown; findings below may reference images not displayed]

FINDINGS: Stable mild scarring in the right middle lobe and lingula. There is
no edema, consolidation, effusion, or pneumothorax. Normal heart
size and mediastinal contours post CABG and coronary stenting.
IMPRESSION: Stable exam.  No evidence of acute disease.

## 2016-10-19 DIAGNOSIS — F17210 Nicotine dependence, cigarettes, uncomplicated: Secondary | ICD-10-CM | POA: Diagnosis not present

## 2016-10-19 DIAGNOSIS — J439 Emphysema, unspecified: Secondary | ICD-10-CM | POA: Diagnosis not present

## 2016-10-19 DIAGNOSIS — R05 Cough: Secondary | ICD-10-CM | POA: Diagnosis not present

## 2016-10-19 DIAGNOSIS — I10 Essential (primary) hypertension: Secondary | ICD-10-CM | POA: Diagnosis not present

## 2016-10-19 DIAGNOSIS — E782 Mixed hyperlipidemia: Secondary | ICD-10-CM | POA: Diagnosis not present

## 2016-10-19 DIAGNOSIS — I25709 Atherosclerosis of coronary artery bypass graft(s), unspecified, with unspecified angina pectoris: Secondary | ICD-10-CM | POA: Diagnosis not present

## 2016-10-19 DIAGNOSIS — M1990 Unspecified osteoarthritis, unspecified site: Secondary | ICD-10-CM | POA: Diagnosis not present

## 2016-10-19 DIAGNOSIS — M5489 Other dorsalgia: Secondary | ICD-10-CM | POA: Diagnosis not present

## 2016-10-19 DIAGNOSIS — G619 Inflammatory polyneuropathy, unspecified: Secondary | ICD-10-CM | POA: Diagnosis not present

## 2016-11-20 DIAGNOSIS — R7309 Other abnormal glucose: Secondary | ICD-10-CM | POA: Diagnosis not present

## 2016-11-20 DIAGNOSIS — D510 Vitamin B12 deficiency anemia due to intrinsic factor deficiency: Secondary | ICD-10-CM | POA: Diagnosis not present

## 2016-11-20 DIAGNOSIS — M1990 Unspecified osteoarthritis, unspecified site: Secondary | ICD-10-CM | POA: Diagnosis not present

## 2016-11-20 DIAGNOSIS — E559 Vitamin D deficiency, unspecified: Secondary | ICD-10-CM | POA: Diagnosis not present

## 2016-11-20 DIAGNOSIS — I25709 Atherosclerosis of coronary artery bypass graft(s), unspecified, with unspecified angina pectoris: Secondary | ICD-10-CM | POA: Diagnosis not present

## 2016-11-20 DIAGNOSIS — R945 Abnormal results of liver function studies: Secondary | ICD-10-CM | POA: Diagnosis not present

## 2016-11-20 DIAGNOSIS — I10 Essential (primary) hypertension: Secondary | ICD-10-CM | POA: Diagnosis not present

## 2016-11-20 DIAGNOSIS — J208 Acute bronchitis due to other specified organisms: Secondary | ICD-10-CM | POA: Diagnosis not present

## 2016-11-20 DIAGNOSIS — E782 Mixed hyperlipidemia: Secondary | ICD-10-CM | POA: Diagnosis not present

## 2016-11-20 DIAGNOSIS — J018 Other acute sinusitis: Secondary | ICD-10-CM | POA: Diagnosis not present

## 2016-11-20 DIAGNOSIS — R748 Abnormal levels of other serum enzymes: Secondary | ICD-10-CM | POA: Diagnosis not present

## 2016-11-20 DIAGNOSIS — R05 Cough: Secondary | ICD-10-CM | POA: Diagnosis not present

## 2016-11-20 DIAGNOSIS — Z125 Encounter for screening for malignant neoplasm of prostate: Secondary | ICD-10-CM | POA: Diagnosis not present

## 2016-12-04 DIAGNOSIS — I10 Essential (primary) hypertension: Secondary | ICD-10-CM | POA: Diagnosis not present

## 2016-12-04 DIAGNOSIS — F17210 Nicotine dependence, cigarettes, uncomplicated: Secondary | ICD-10-CM | POA: Diagnosis not present

## 2016-12-04 DIAGNOSIS — I25709 Atherosclerosis of coronary artery bypass graft(s), unspecified, with unspecified angina pectoris: Secondary | ICD-10-CM | POA: Diagnosis not present

## 2016-12-04 DIAGNOSIS — J439 Emphysema, unspecified: Secondary | ICD-10-CM | POA: Diagnosis not present

## 2016-12-04 DIAGNOSIS — E782 Mixed hyperlipidemia: Secondary | ICD-10-CM | POA: Diagnosis not present

## 2017-01-14 IMAGING — CR DG CHEST 2V
2 series · 2 of 2 positions shown · non-contrast
Comparison: Of [DATE]

CLINICAL DATA: Central chest pain, mild shortness of breath, onset
of symptoms this evening, elevated blood pressure at home, RIGHT
side neck and BILATERAL shoulder pain, history hypertension,
coronary artery disease post MI and CABG, CHF, COPD, GERD, stroke

EXAM:
CHEST  2 VIEW

[chest pa]
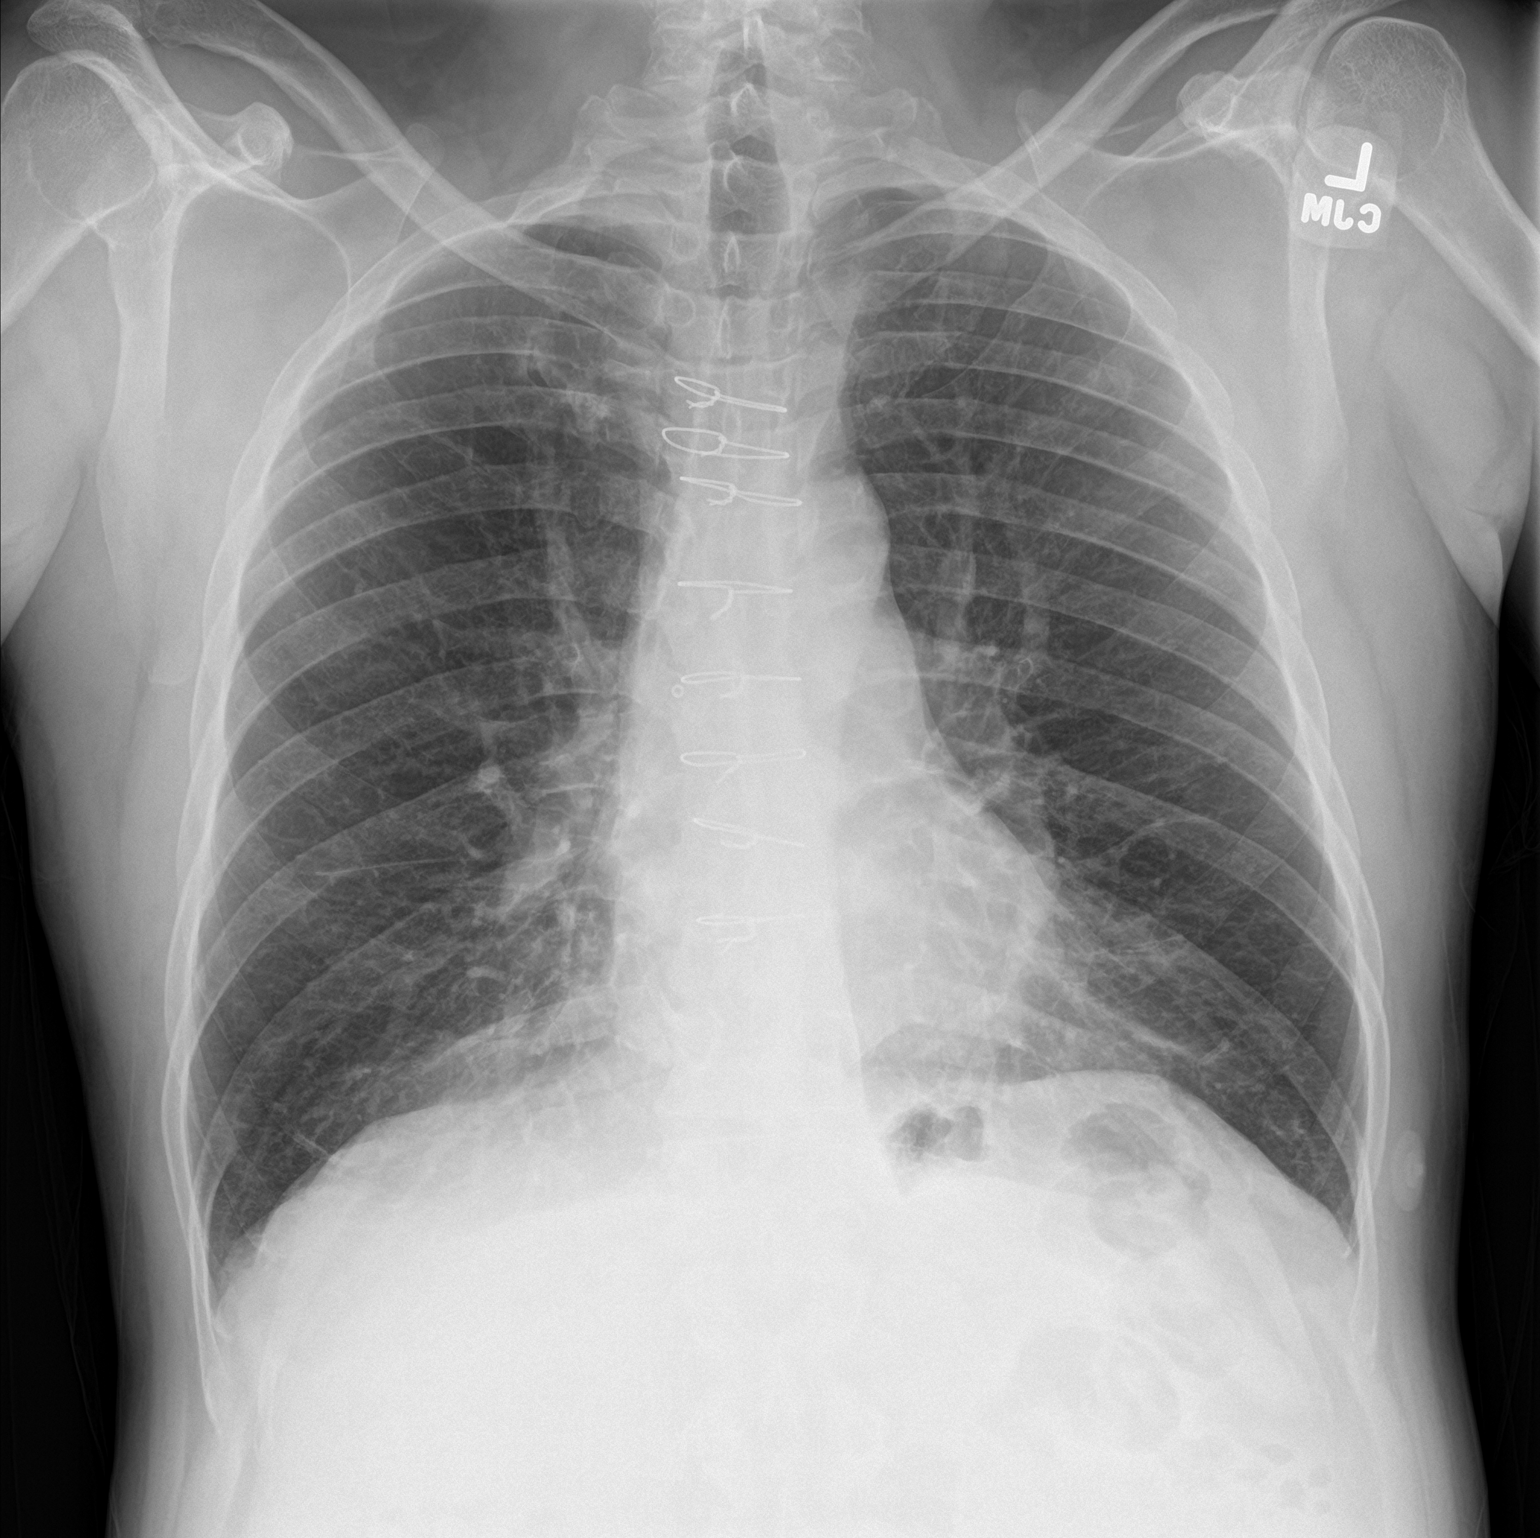

[chest lat]
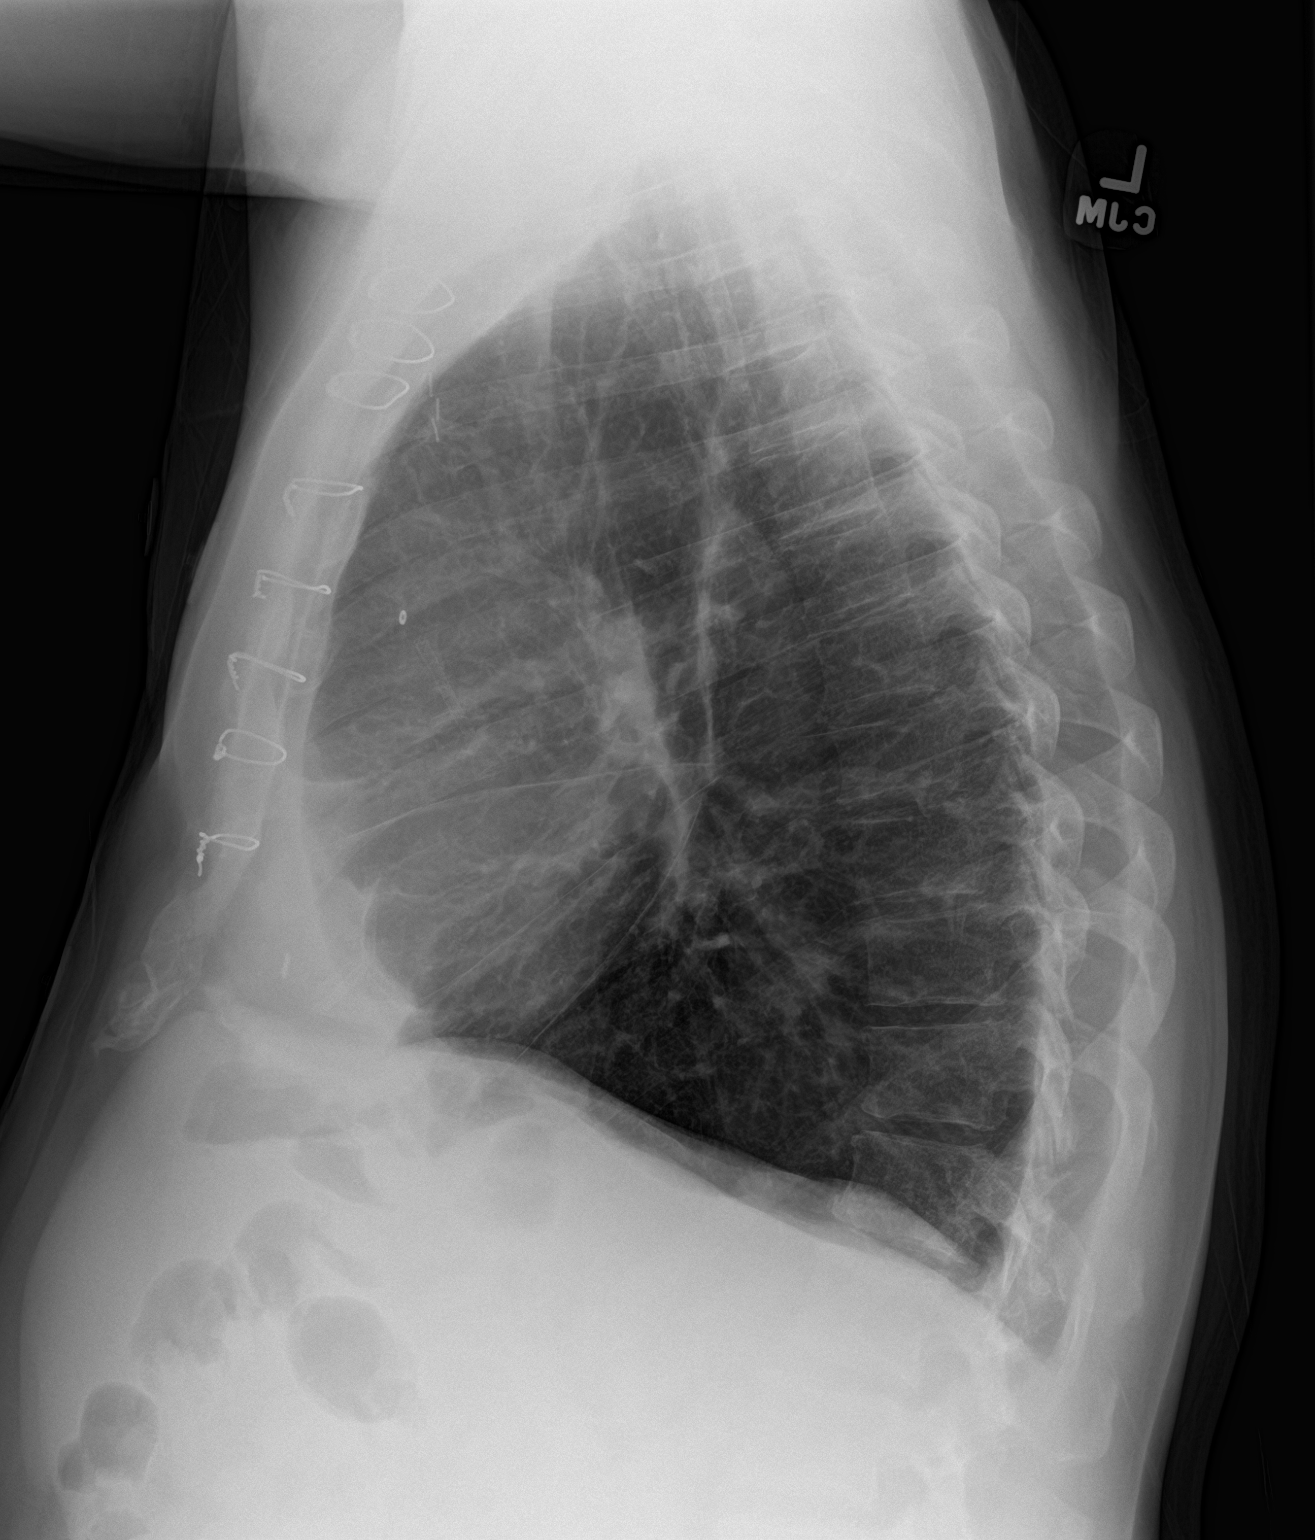

[2 of 2 positions shown; findings below may reference images not displayed]

FINDINGS: Normal heart size post CABG.

Mediastinal contours and pulmonary vascularity normal.

Emphysematous and bronchitic changes consistent with COPD.

No acute infiltrate, pleural effusion or pneumothorax.

Mild atherosclerotic calcification aortic arch.

Bones slightly demineralized.
IMPRESSION: COPD changes.

Post CABG.

No acute abnormalities.

## 2017-03-15 DIAGNOSIS — I10 Essential (primary) hypertension: Secondary | ICD-10-CM | POA: Diagnosis not present

## 2017-03-15 DIAGNOSIS — I25709 Atherosclerosis of coronary artery bypass graft(s), unspecified, with unspecified angina pectoris: Secondary | ICD-10-CM | POA: Diagnosis not present

## 2017-03-15 DIAGNOSIS — J439 Emphysema, unspecified: Secondary | ICD-10-CM | POA: Diagnosis not present

## 2017-03-15 DIAGNOSIS — M1990 Unspecified osteoarthritis, unspecified site: Secondary | ICD-10-CM | POA: Diagnosis not present

## 2017-03-15 DIAGNOSIS — F17210 Nicotine dependence, cigarettes, uncomplicated: Secondary | ICD-10-CM | POA: Diagnosis not present

## 2017-03-15 DIAGNOSIS — E782 Mixed hyperlipidemia: Secondary | ICD-10-CM | POA: Diagnosis not present

## 2017-03-15 DIAGNOSIS — J018 Other acute sinusitis: Secondary | ICD-10-CM | POA: Diagnosis not present

## 2017-03-15 DIAGNOSIS — G619 Inflammatory polyneuropathy, unspecified: Secondary | ICD-10-CM | POA: Diagnosis not present

## 2017-03-21 DIAGNOSIS — I25709 Atherosclerosis of coronary artery bypass graft(s), unspecified, with unspecified angina pectoris: Secondary | ICD-10-CM | POA: Diagnosis not present

## 2017-03-21 DIAGNOSIS — E782 Mixed hyperlipidemia: Secondary | ICD-10-CM | POA: Diagnosis not present

## 2017-03-21 DIAGNOSIS — I10 Essential (primary) hypertension: Secondary | ICD-10-CM | POA: Diagnosis not present

## 2017-03-21 DIAGNOSIS — F17210 Nicotine dependence, cigarettes, uncomplicated: Secondary | ICD-10-CM | POA: Diagnosis not present

## 2017-03-21 DIAGNOSIS — M1990 Unspecified osteoarthritis, unspecified site: Secondary | ICD-10-CM | POA: Diagnosis not present

## 2017-03-21 DIAGNOSIS — G619 Inflammatory polyneuropathy, unspecified: Secondary | ICD-10-CM | POA: Diagnosis not present

## 2017-03-21 DIAGNOSIS — J439 Emphysema, unspecified: Secondary | ICD-10-CM | POA: Diagnosis not present

## 2017-03-21 DIAGNOSIS — E559 Vitamin D deficiency, unspecified: Secondary | ICD-10-CM | POA: Diagnosis not present

## 2017-03-22 DIAGNOSIS — Z23 Encounter for immunization: Secondary | ICD-10-CM | POA: Diagnosis not present

## 2017-04-08 DIAGNOSIS — I25810 Atherosclerosis of coronary artery bypass graft(s) without angina pectoris: Secondary | ICD-10-CM | POA: Diagnosis not present

## 2017-04-08 DIAGNOSIS — K5790 Diverticulosis of intestine, part unspecified, without perforation or abscess without bleeding: Secondary | ICD-10-CM | POA: Diagnosis not present

## 2017-04-08 DIAGNOSIS — Z743 Need for continuous supervision: Secondary | ICD-10-CM | POA: Diagnosis not present

## 2017-04-08 DIAGNOSIS — K5791 Diverticulosis of intestine, part unspecified, without perforation or abscess with bleeding: Secondary | ICD-10-CM | POA: Diagnosis not present

## 2017-04-08 DIAGNOSIS — K5731 Diverticulosis of large intestine without perforation or abscess with bleeding: Secondary | ICD-10-CM | POA: Diagnosis not present

## 2017-04-08 DIAGNOSIS — F17210 Nicotine dependence, cigarettes, uncomplicated: Secondary | ICD-10-CM | POA: Diagnosis not present

## 2017-04-08 DIAGNOSIS — J439 Emphysema, unspecified: Secondary | ICD-10-CM | POA: Diagnosis not present

## 2017-04-08 DIAGNOSIS — R1032 Left lower quadrant pain: Secondary | ICD-10-CM | POA: Diagnosis not present

## 2017-04-08 DIAGNOSIS — M2550 Pain in unspecified joint: Secondary | ICD-10-CM | POA: Diagnosis not present

## 2017-04-08 DIAGNOSIS — Z79899 Other long term (current) drug therapy: Secondary | ICD-10-CM | POA: Diagnosis not present

## 2017-04-08 DIAGNOSIS — Z888 Allergy status to other drugs, medicaments and biological substances status: Secondary | ICD-10-CM | POA: Diagnosis not present

## 2017-04-08 DIAGNOSIS — Z885 Allergy status to narcotic agent status: Secondary | ICD-10-CM | POA: Diagnosis not present

## 2017-04-08 DIAGNOSIS — K922 Gastrointestinal hemorrhage, unspecified: Secondary | ICD-10-CM | POA: Diagnosis not present

## 2017-04-08 DIAGNOSIS — I2510 Atherosclerotic heart disease of native coronary artery without angina pectoris: Secondary | ICD-10-CM | POA: Diagnosis not present

## 2017-04-08 DIAGNOSIS — Z9861 Coronary angioplasty status: Secondary | ICD-10-CM | POA: Diagnosis not present

## 2017-04-08 DIAGNOSIS — E785 Hyperlipidemia, unspecified: Secondary | ICD-10-CM | POA: Diagnosis not present

## 2017-04-08 DIAGNOSIS — K219 Gastro-esophageal reflux disease without esophagitis: Secondary | ICD-10-CM | POA: Diagnosis not present

## 2017-04-08 DIAGNOSIS — I10 Essential (primary) hypertension: Secondary | ICD-10-CM | POA: Diagnosis not present

## 2017-04-08 DIAGNOSIS — K625 Hemorrhage of anus and rectum: Secondary | ICD-10-CM | POA: Diagnosis not present

## 2017-04-08 DIAGNOSIS — Z7982 Long term (current) use of aspirin: Secondary | ICD-10-CM | POA: Diagnosis not present

## 2017-04-08 DIAGNOSIS — Z8673 Personal history of transient ischemic attack (TIA), and cerebral infarction without residual deficits: Secondary | ICD-10-CM | POA: Diagnosis not present

## 2017-04-08 DIAGNOSIS — J449 Chronic obstructive pulmonary disease, unspecified: Secondary | ICD-10-CM | POA: Diagnosis not present

## 2017-04-09 DIAGNOSIS — R1084 Generalized abdominal pain: Secondary | ICD-10-CM | POA: Diagnosis not present

## 2017-04-09 DIAGNOSIS — J449 Chronic obstructive pulmonary disease, unspecified: Secondary | ICD-10-CM | POA: Diagnosis not present

## 2017-04-09 DIAGNOSIS — F17210 Nicotine dependence, cigarettes, uncomplicated: Secondary | ICD-10-CM | POA: Diagnosis not present

## 2017-04-09 DIAGNOSIS — D62 Acute posthemorrhagic anemia: Secondary | ICD-10-CM | POA: Diagnosis not present

## 2017-04-09 DIAGNOSIS — K922 Gastrointestinal hemorrhage, unspecified: Secondary | ICD-10-CM | POA: Diagnosis not present

## 2017-04-09 DIAGNOSIS — I2510 Atherosclerotic heart disease of native coronary artery without angina pectoris: Secondary | ICD-10-CM | POA: Diagnosis not present

## 2017-04-09 DIAGNOSIS — K769 Liver disease, unspecified: Secondary | ICD-10-CM | POA: Diagnosis not present

## 2017-04-09 DIAGNOSIS — K5730 Diverticulosis of large intestine without perforation or abscess without bleeding: Secondary | ICD-10-CM | POA: Diagnosis not present

## 2017-04-09 DIAGNOSIS — I9589 Other hypotension: Secondary | ICD-10-CM | POA: Diagnosis not present

## 2017-04-09 DIAGNOSIS — K921 Melena: Secondary | ICD-10-CM | POA: Diagnosis not present

## 2017-04-09 DIAGNOSIS — E785 Hyperlipidemia, unspecified: Secondary | ICD-10-CM | POA: Diagnosis not present

## 2017-04-10 DIAGNOSIS — K219 Gastro-esophageal reflux disease without esophagitis: Secondary | ICD-10-CM | POA: Diagnosis not present

## 2017-04-10 DIAGNOSIS — I9589 Other hypotension: Secondary | ICD-10-CM | POA: Diagnosis not present

## 2017-04-10 DIAGNOSIS — F17218 Nicotine dependence, cigarettes, with other nicotine-induced disorders: Secondary | ICD-10-CM | POA: Diagnosis not present

## 2017-04-10 DIAGNOSIS — K5731 Diverticulosis of large intestine without perforation or abscess with bleeding: Secondary | ICD-10-CM | POA: Diagnosis not present

## 2017-04-10 DIAGNOSIS — R109 Unspecified abdominal pain: Secondary | ICD-10-CM | POA: Diagnosis not present

## 2017-04-10 DIAGNOSIS — D120 Benign neoplasm of cecum: Secondary | ICD-10-CM | POA: Diagnosis not present

## 2017-04-10 DIAGNOSIS — K621 Rectal polyp: Secondary | ICD-10-CM | POA: Diagnosis not present

## 2017-04-10 DIAGNOSIS — K921 Melena: Secondary | ICD-10-CM | POA: Diagnosis not present

## 2017-04-10 DIAGNOSIS — J449 Chronic obstructive pulmonary disease, unspecified: Secondary | ICD-10-CM | POA: Diagnosis not present

## 2017-04-10 DIAGNOSIS — E861 Hypovolemia: Secondary | ICD-10-CM | POA: Diagnosis not present

## 2017-04-10 DIAGNOSIS — K635 Polyp of colon: Secondary | ICD-10-CM | POA: Diagnosis not present

## 2017-04-10 DIAGNOSIS — K922 Gastrointestinal hemorrhage, unspecified: Secondary | ICD-10-CM | POA: Diagnosis not present

## 2017-04-10 DIAGNOSIS — D128 Benign neoplasm of rectum: Secondary | ICD-10-CM | POA: Diagnosis not present

## 2017-04-10 DIAGNOSIS — R1084 Generalized abdominal pain: Secondary | ICD-10-CM | POA: Diagnosis not present

## 2017-04-10 DIAGNOSIS — D62 Acute posthemorrhagic anemia: Secondary | ICD-10-CM | POA: Diagnosis not present

## 2017-04-10 DIAGNOSIS — K869 Disease of pancreas, unspecified: Secondary | ICD-10-CM | POA: Diagnosis not present

## 2017-04-10 DIAGNOSIS — I10 Essential (primary) hypertension: Secondary | ICD-10-CM | POA: Diagnosis not present

## 2017-04-10 DIAGNOSIS — K648 Other hemorrhoids: Secondary | ICD-10-CM | POA: Diagnosis not present

## 2017-04-11 DIAGNOSIS — I10 Essential (primary) hypertension: Secondary | ICD-10-CM | POA: Diagnosis not present

## 2017-04-11 DIAGNOSIS — D62 Acute posthemorrhagic anemia: Secondary | ICD-10-CM | POA: Diagnosis not present

## 2017-04-11 DIAGNOSIS — K219 Gastro-esophageal reflux disease without esophagitis: Secondary | ICD-10-CM | POA: Diagnosis not present

## 2017-04-11 DIAGNOSIS — K869 Disease of pancreas, unspecified: Secondary | ICD-10-CM | POA: Diagnosis not present

## 2017-04-11 DIAGNOSIS — E785 Hyperlipidemia, unspecified: Secondary | ICD-10-CM | POA: Diagnosis not present

## 2017-04-11 DIAGNOSIS — I2510 Atherosclerotic heart disease of native coronary artery without angina pectoris: Secondary | ICD-10-CM | POA: Diagnosis not present

## 2017-04-11 DIAGNOSIS — R1084 Generalized abdominal pain: Secondary | ICD-10-CM | POA: Diagnosis not present

## 2017-04-11 DIAGNOSIS — K635 Polyp of colon: Secondary | ICD-10-CM | POA: Diagnosis not present

## 2017-04-11 DIAGNOSIS — K922 Gastrointestinal hemorrhage, unspecified: Secondary | ICD-10-CM | POA: Diagnosis not present

## 2017-04-11 DIAGNOSIS — F17210 Nicotine dependence, cigarettes, uncomplicated: Secondary | ICD-10-CM | POA: Diagnosis not present

## 2017-04-11 DIAGNOSIS — I9589 Other hypotension: Secondary | ICD-10-CM | POA: Diagnosis not present

## 2017-04-11 DIAGNOSIS — K921 Melena: Secondary | ICD-10-CM | POA: Diagnosis not present

## 2017-04-11 DIAGNOSIS — J449 Chronic obstructive pulmonary disease, unspecified: Secondary | ICD-10-CM | POA: Diagnosis not present

## 2017-04-12 DIAGNOSIS — D649 Anemia, unspecified: Secondary | ICD-10-CM | POA: Diagnosis not present

## 2017-04-12 DIAGNOSIS — K228 Other specified diseases of esophagus: Secondary | ICD-10-CM | POA: Diagnosis not present

## 2017-04-12 DIAGNOSIS — K922 Gastrointestinal hemorrhage, unspecified: Secondary | ICD-10-CM | POA: Diagnosis not present

## 2017-04-13 DIAGNOSIS — D62 Acute posthemorrhagic anemia: Secondary | ICD-10-CM | POA: Diagnosis present

## 2017-04-13 DIAGNOSIS — K921 Melena: Secondary | ICD-10-CM | POA: Diagnosis not present

## 2017-04-13 DIAGNOSIS — I2510 Atherosclerotic heart disease of native coronary artery without angina pectoris: Secondary | ICD-10-CM | POA: Diagnosis not present

## 2017-04-13 DIAGNOSIS — Z955 Presence of coronary angioplasty implant and graft: Secondary | ICD-10-CM | POA: Diagnosis not present

## 2017-04-13 DIAGNOSIS — K922 Gastrointestinal hemorrhage, unspecified: Secondary | ICD-10-CM | POA: Diagnosis not present

## 2017-04-13 DIAGNOSIS — J449 Chronic obstructive pulmonary disease, unspecified: Secondary | ICD-10-CM | POA: Diagnosis not present

## 2017-04-13 DIAGNOSIS — D649 Anemia, unspecified: Secondary | ICD-10-CM | POA: Diagnosis not present

## 2017-04-13 DIAGNOSIS — Z7982 Long term (current) use of aspirin: Secondary | ICD-10-CM | POA: Diagnosis not present

## 2017-04-13 DIAGNOSIS — K219 Gastro-esophageal reflux disease without esophagitis: Secondary | ICD-10-CM | POA: Diagnosis present

## 2017-04-13 DIAGNOSIS — K228 Other specified diseases of esophagus: Secondary | ICD-10-CM | POA: Diagnosis not present

## 2017-04-13 DIAGNOSIS — K5790 Diverticulosis of intestine, part unspecified, without perforation or abscess without bleeding: Secondary | ICD-10-CM | POA: Diagnosis not present

## 2017-04-13 DIAGNOSIS — F419 Anxiety disorder, unspecified: Secondary | ICD-10-CM | POA: Diagnosis present

## 2017-04-13 DIAGNOSIS — Z8489 Family history of other specified conditions: Secondary | ICD-10-CM | POA: Diagnosis not present

## 2017-04-13 DIAGNOSIS — E785 Hyperlipidemia, unspecified: Secondary | ICD-10-CM | POA: Diagnosis present

## 2017-04-13 DIAGNOSIS — I10 Essential (primary) hypertension: Secondary | ICD-10-CM | POA: Diagnosis present

## 2017-04-13 DIAGNOSIS — K5731 Diverticulosis of large intestine without perforation or abscess with bleeding: Secondary | ICD-10-CM | POA: Diagnosis present

## 2017-04-13 DIAGNOSIS — K5730 Diverticulosis of large intestine without perforation or abscess without bleeding: Secondary | ICD-10-CM | POA: Diagnosis not present

## 2017-04-13 DIAGNOSIS — K31819 Angiodysplasia of stomach and duodenum without bleeding: Secondary | ICD-10-CM | POA: Diagnosis present

## 2017-04-13 DIAGNOSIS — R109 Unspecified abdominal pain: Secondary | ICD-10-CM | POA: Diagnosis not present

## 2017-04-13 DIAGNOSIS — Z951 Presence of aortocoronary bypass graft: Secondary | ICD-10-CM | POA: Diagnosis not present

## 2017-04-13 DIAGNOSIS — K2981 Duodenitis with bleeding: Secondary | ICD-10-CM | POA: Diagnosis present

## 2017-07-13 IMAGING — CR DG CHEST 2V
2 series · 2 of 2 positions shown · non-contrast
Comparison: Chest radiograph dated 05/21/2015

CLINICAL DATA: 60-year-old male with chest pain and shortness of
breath

EXAM:
CHEST  2 VIEW

[chest pa]
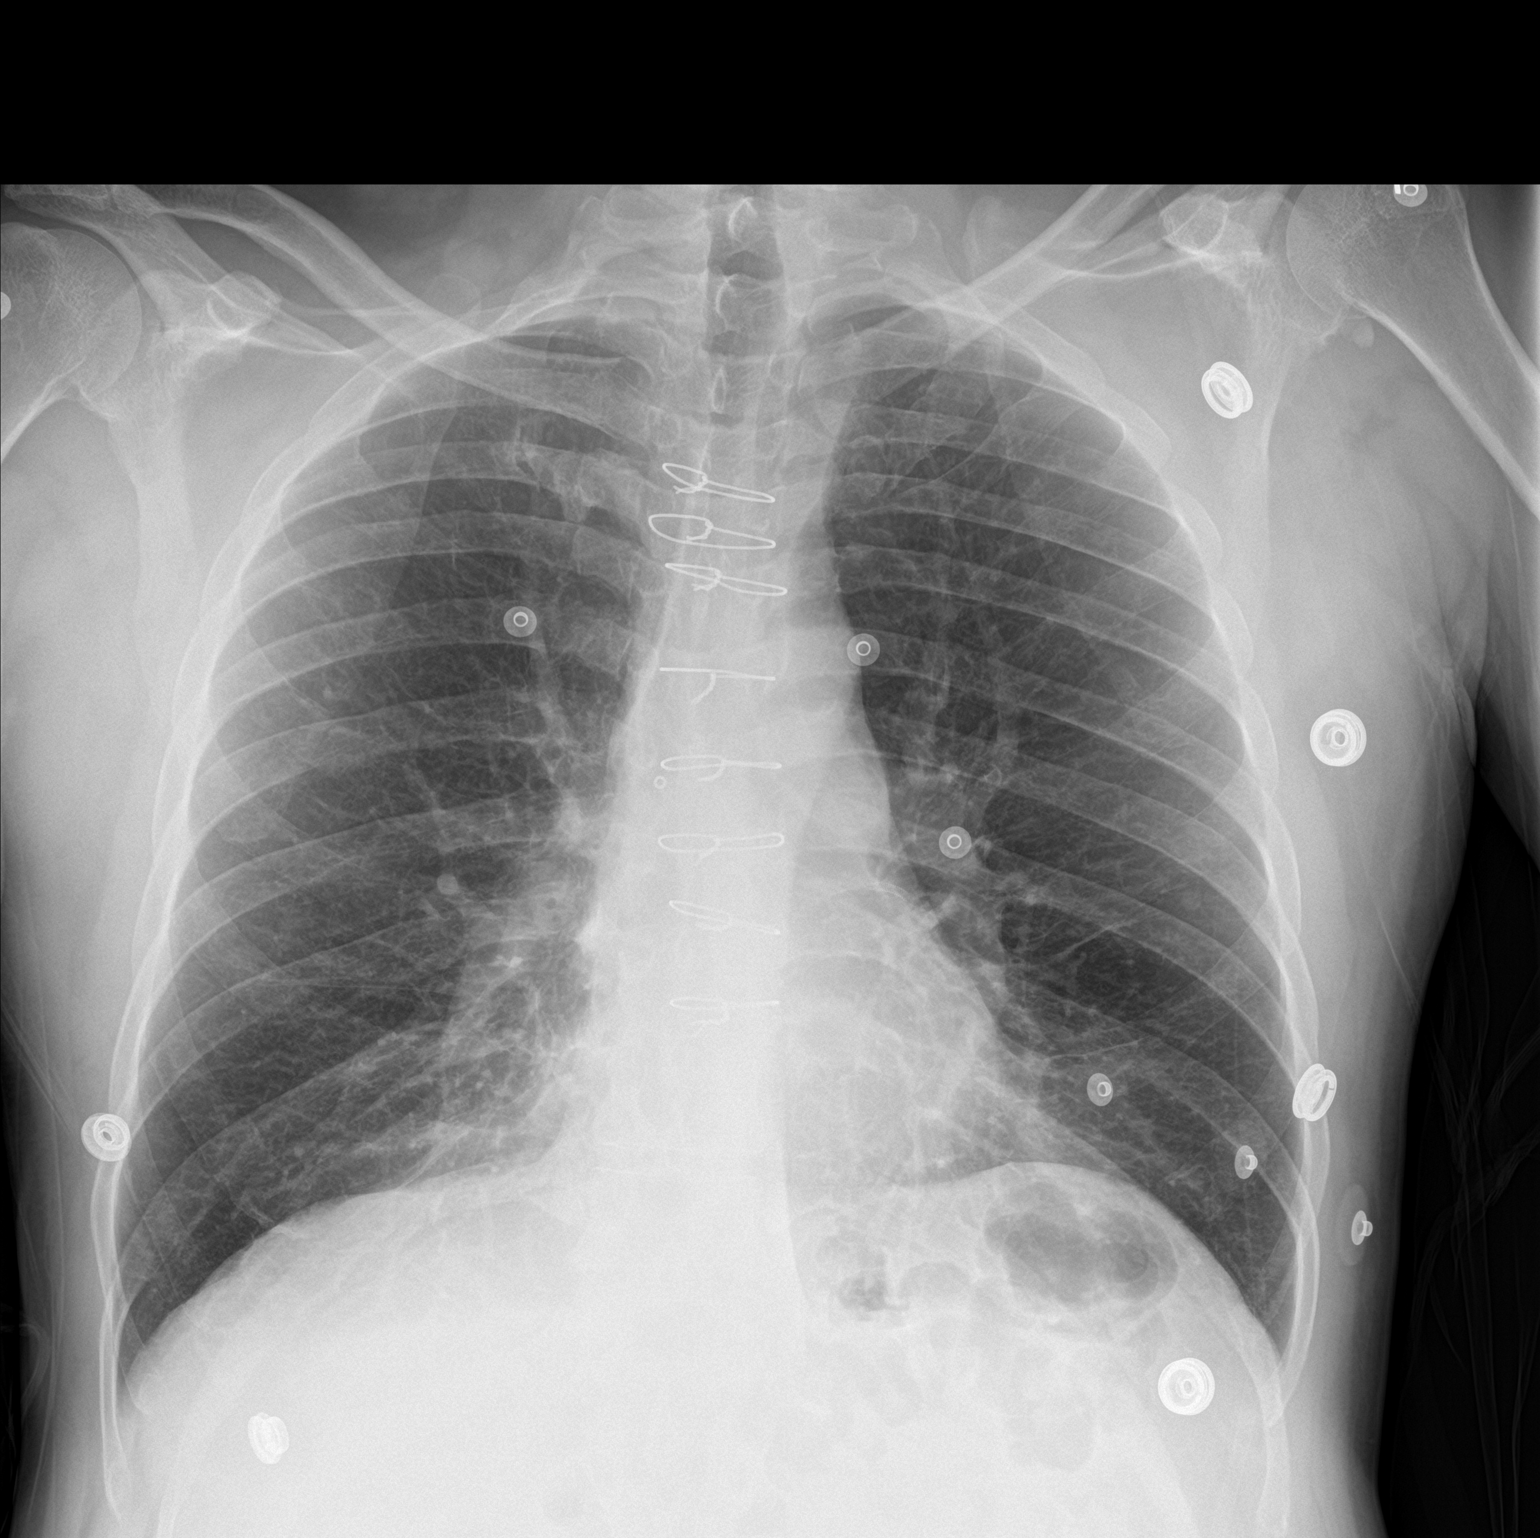

[chest lat]
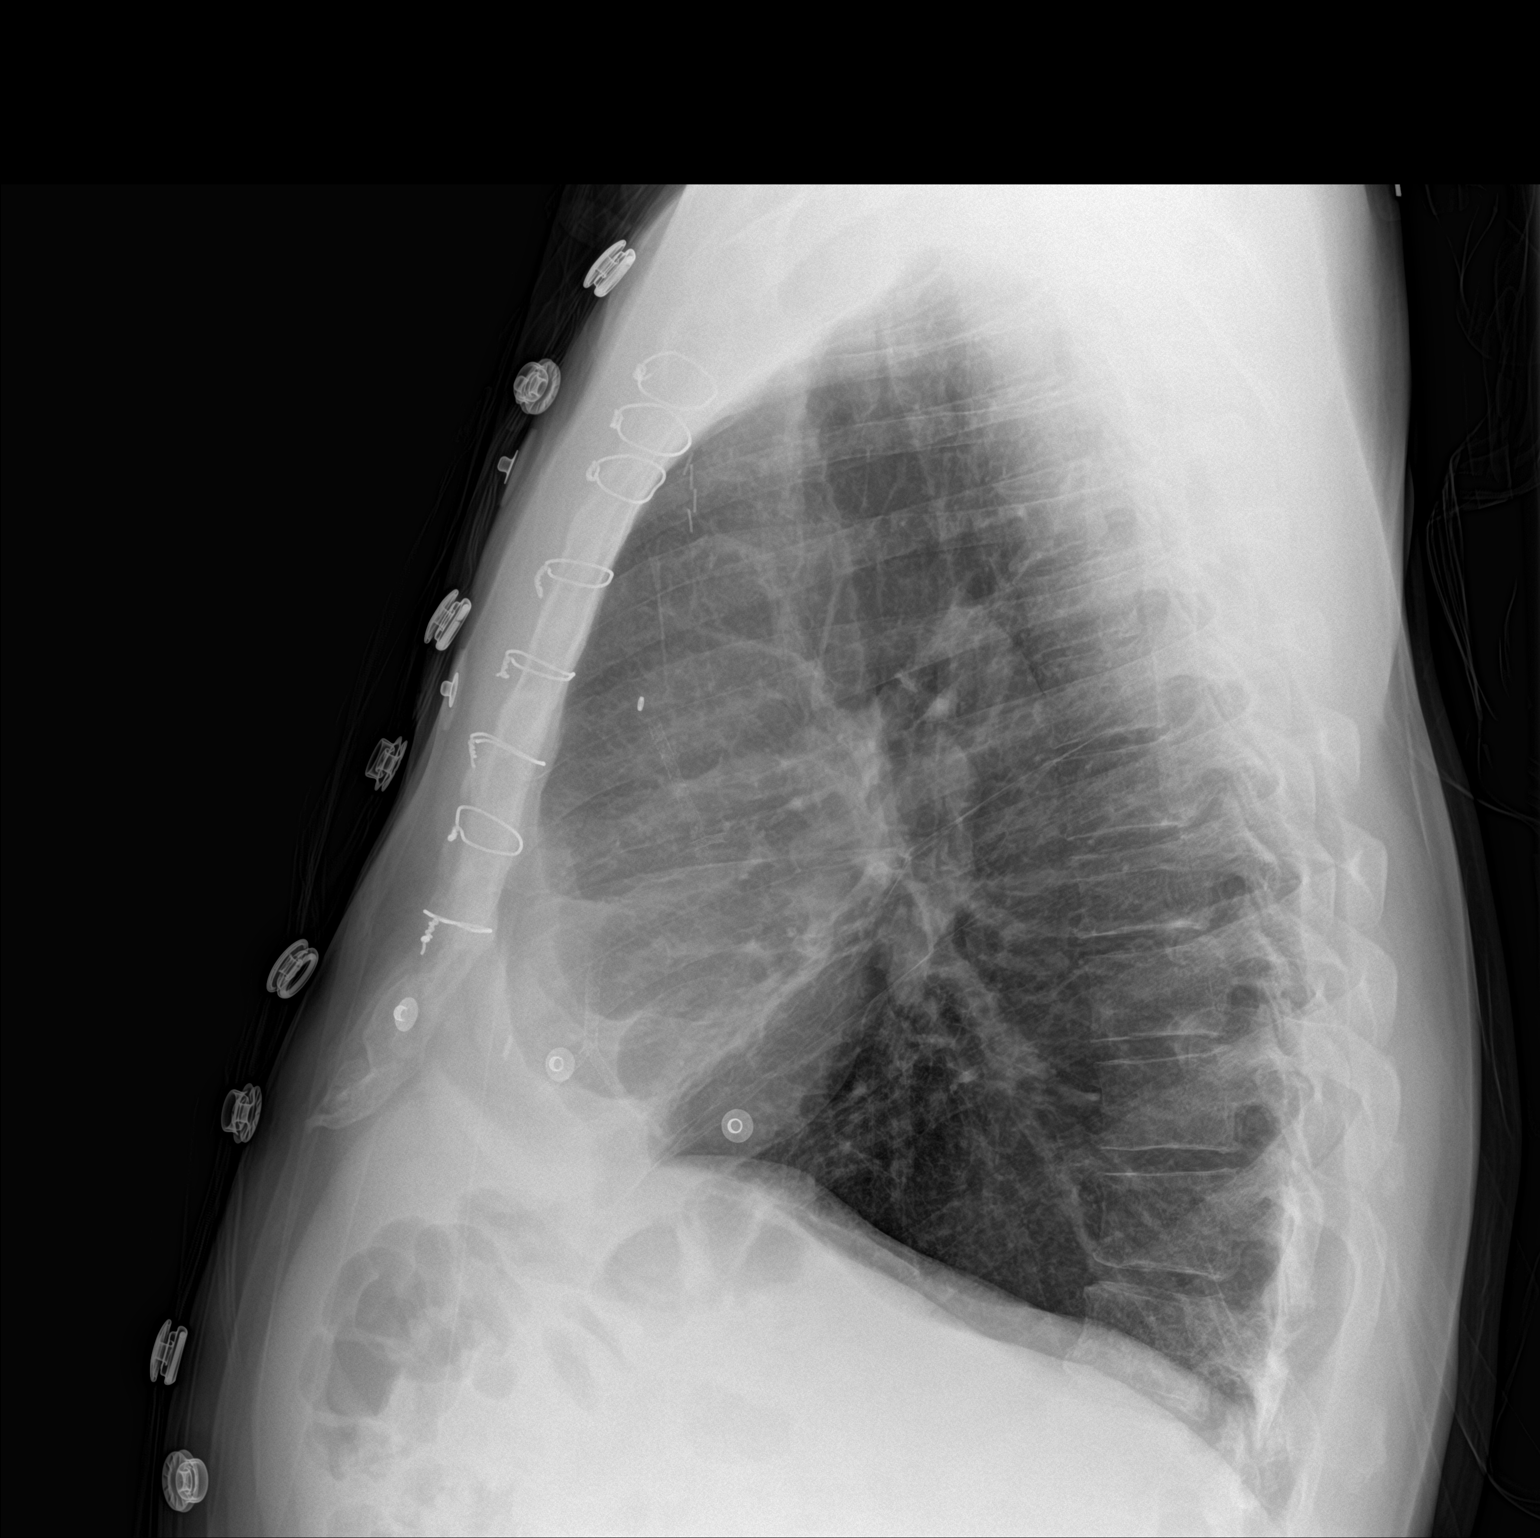

[2 of 2 positions shown; findings below may reference images not displayed]

FINDINGS: Two views of the chest demonstrate mild emphysematous changes of the
lungs with bibasilar linear atelectasis/ scarring. There is no focal
consolidation, pleural effusion, or pneumothorax. The cardiac
silhouette is within normal limits. Median sternotomy wires noted.
No acute osseous pathology.
IMPRESSION: No active cardiopulmonary disease.

## 2017-07-19 DIAGNOSIS — I25709 Atherosclerosis of coronary artery bypass graft(s), unspecified, with unspecified angina pectoris: Secondary | ICD-10-CM | POA: Diagnosis not present

## 2017-07-19 DIAGNOSIS — G619 Inflammatory polyneuropathy, unspecified: Secondary | ICD-10-CM | POA: Diagnosis not present

## 2017-07-19 DIAGNOSIS — J439 Emphysema, unspecified: Secondary | ICD-10-CM | POA: Diagnosis not present

## 2017-07-19 DIAGNOSIS — I10 Essential (primary) hypertension: Secondary | ICD-10-CM | POA: Diagnosis not present

## 2017-07-19 DIAGNOSIS — E782 Mixed hyperlipidemia: Secondary | ICD-10-CM | POA: Diagnosis not present

## 2017-07-19 DIAGNOSIS — F17210 Nicotine dependence, cigarettes, uncomplicated: Secondary | ICD-10-CM | POA: Diagnosis not present

## 2017-08-08 DIAGNOSIS — I2510 Atherosclerotic heart disease of native coronary artery without angina pectoris: Secondary | ICD-10-CM | POA: Diagnosis not present

## 2017-08-08 DIAGNOSIS — J209 Acute bronchitis, unspecified: Secondary | ICD-10-CM | POA: Diagnosis not present

## 2017-08-08 DIAGNOSIS — R0600 Dyspnea, unspecified: Secondary | ICD-10-CM | POA: Diagnosis not present

## 2017-08-08 DIAGNOSIS — A419 Sepsis, unspecified organism: Secondary | ICD-10-CM | POA: Diagnosis not present

## 2017-08-08 DIAGNOSIS — Z87891 Personal history of nicotine dependence: Secondary | ICD-10-CM | POA: Diagnosis not present

## 2017-08-08 DIAGNOSIS — R05 Cough: Secondary | ICD-10-CM | POA: Diagnosis not present

## 2017-08-08 DIAGNOSIS — J449 Chronic obstructive pulmonary disease, unspecified: Secondary | ICD-10-CM | POA: Diagnosis not present

## 2017-08-27 DIAGNOSIS — E782 Mixed hyperlipidemia: Secondary | ICD-10-CM | POA: Diagnosis not present

## 2017-08-27 DIAGNOSIS — K5730 Diverticulosis of large intestine without perforation or abscess without bleeding: Secondary | ICD-10-CM | POA: Diagnosis not present

## 2017-08-27 DIAGNOSIS — M5489 Other dorsalgia: Secondary | ICD-10-CM | POA: Diagnosis not present

## 2017-08-27 DIAGNOSIS — I25709 Atherosclerosis of coronary artery bypass graft(s), unspecified, with unspecified angina pectoris: Secondary | ICD-10-CM | POA: Diagnosis not present

## 2017-08-27 DIAGNOSIS — M5126 Other intervertebral disc displacement, lumbar region: Secondary | ICD-10-CM | POA: Diagnosis not present

## 2017-08-27 DIAGNOSIS — F17210 Nicotine dependence, cigarettes, uncomplicated: Secondary | ICD-10-CM | POA: Diagnosis not present

## 2017-08-27 DIAGNOSIS — M1288 Other specific arthropathies, not elsewhere classified, other specified site: Secondary | ICD-10-CM | POA: Diagnosis not present

## 2017-08-27 DIAGNOSIS — I10 Essential (primary) hypertension: Secondary | ICD-10-CM | POA: Diagnosis not present

## 2017-08-27 DIAGNOSIS — G619 Inflammatory polyneuropathy, unspecified: Secondary | ICD-10-CM | POA: Diagnosis not present

## 2017-08-27 DIAGNOSIS — J439 Emphysema, unspecified: Secondary | ICD-10-CM | POA: Diagnosis not present

## 2017-08-30 DIAGNOSIS — J439 Emphysema, unspecified: Secondary | ICD-10-CM | POA: Diagnosis not present

## 2017-08-30 DIAGNOSIS — M5489 Other dorsalgia: Secondary | ICD-10-CM | POA: Diagnosis not present

## 2017-08-30 DIAGNOSIS — M1990 Unspecified osteoarthritis, unspecified site: Secondary | ICD-10-CM | POA: Diagnosis not present

## 2017-08-30 DIAGNOSIS — E782 Mixed hyperlipidemia: Secondary | ICD-10-CM | POA: Diagnosis not present

## 2017-08-30 DIAGNOSIS — G619 Inflammatory polyneuropathy, unspecified: Secondary | ICD-10-CM | POA: Diagnosis not present

## 2017-08-30 DIAGNOSIS — I10 Essential (primary) hypertension: Secondary | ICD-10-CM | POA: Diagnosis not present

## 2017-08-30 DIAGNOSIS — F17210 Nicotine dependence, cigarettes, uncomplicated: Secondary | ICD-10-CM | POA: Diagnosis not present

## 2017-08-30 DIAGNOSIS — J302 Other seasonal allergic rhinitis: Secondary | ICD-10-CM | POA: Diagnosis not present

## 2017-08-30 DIAGNOSIS — I25709 Atherosclerosis of coronary artery bypass graft(s), unspecified, with unspecified angina pectoris: Secondary | ICD-10-CM | POA: Diagnosis not present

## 2017-10-04 DIAGNOSIS — I10 Essential (primary) hypertension: Secondary | ICD-10-CM | POA: Diagnosis not present

## 2017-10-04 DIAGNOSIS — E782 Mixed hyperlipidemia: Secondary | ICD-10-CM | POA: Diagnosis not present

## 2017-10-04 DIAGNOSIS — D510 Vitamin B12 deficiency anemia due to intrinsic factor deficiency: Secondary | ICD-10-CM | POA: Diagnosis not present

## 2017-10-04 DIAGNOSIS — G619 Inflammatory polyneuropathy, unspecified: Secondary | ICD-10-CM | POA: Diagnosis not present

## 2017-10-04 DIAGNOSIS — F17210 Nicotine dependence, cigarettes, uncomplicated: Secondary | ICD-10-CM | POA: Diagnosis not present

## 2017-10-04 DIAGNOSIS — E559 Vitamin D deficiency, unspecified: Secondary | ICD-10-CM | POA: Diagnosis not present

## 2017-10-04 DIAGNOSIS — J439 Emphysema, unspecified: Secondary | ICD-10-CM | POA: Diagnosis not present

## 2017-10-04 DIAGNOSIS — I25709 Atherosclerosis of coronary artery bypass graft(s), unspecified, with unspecified angina pectoris: Secondary | ICD-10-CM | POA: Diagnosis not present

## 2017-10-04 DIAGNOSIS — Z125 Encounter for screening for malignant neoplasm of prostate: Secondary | ICD-10-CM | POA: Diagnosis not present

## 2017-10-11 DIAGNOSIS — K5730 Diverticulosis of large intestine without perforation or abscess without bleeding: Secondary | ICD-10-CM | POA: Diagnosis not present

## 2017-10-21 DIAGNOSIS — G2581 Restless legs syndrome: Secondary | ICD-10-CM | POA: Diagnosis not present

## 2017-10-21 DIAGNOSIS — J441 Chronic obstructive pulmonary disease with (acute) exacerbation: Secondary | ICD-10-CM | POA: Diagnosis not present

## 2017-10-21 DIAGNOSIS — Z87891 Personal history of nicotine dependence: Secondary | ICD-10-CM | POA: Diagnosis not present

## 2017-11-05 DIAGNOSIS — G2581 Restless legs syndrome: Secondary | ICD-10-CM | POA: Diagnosis not present

## 2017-11-05 DIAGNOSIS — R7989 Other specified abnormal findings of blood chemistry: Secondary | ICD-10-CM | POA: Diagnosis not present

## 2017-11-05 DIAGNOSIS — J441 Chronic obstructive pulmonary disease with (acute) exacerbation: Secondary | ICD-10-CM | POA: Diagnosis not present

## 2017-11-05 DIAGNOSIS — R0602 Shortness of breath: Secondary | ICD-10-CM | POA: Diagnosis not present

## 2017-11-09 DIAGNOSIS — I25119 Atherosclerotic heart disease of native coronary artery with unspecified angina pectoris: Secondary | ICD-10-CM | POA: Diagnosis not present

## 2017-11-09 DIAGNOSIS — I10 Essential (primary) hypertension: Secondary | ICD-10-CM | POA: Diagnosis not present

## 2017-11-09 DIAGNOSIS — E785 Hyperlipidemia, unspecified: Secondary | ICD-10-CM | POA: Diagnosis not present

## 2017-11-14 DIAGNOSIS — I25119 Atherosclerotic heart disease of native coronary artery with unspecified angina pectoris: Secondary | ICD-10-CM | POA: Diagnosis not present

## 2017-11-15 DIAGNOSIS — I25119 Atherosclerotic heart disease of native coronary artery with unspecified angina pectoris: Secondary | ICD-10-CM | POA: Diagnosis not present

## 2017-11-25 DIAGNOSIS — J189 Pneumonia, unspecified organism: Secondary | ICD-10-CM | POA: Diagnosis not present

## 2017-11-25 DIAGNOSIS — R0602 Shortness of breath: Secondary | ICD-10-CM | POA: Diagnosis not present

## 2017-11-25 DIAGNOSIS — J441 Chronic obstructive pulmonary disease with (acute) exacerbation: Secondary | ICD-10-CM | POA: Diagnosis not present

## 2017-11-26 DIAGNOSIS — I2510 Atherosclerotic heart disease of native coronary artery without angina pectoris: Secondary | ICD-10-CM | POA: Diagnosis not present

## 2017-11-26 DIAGNOSIS — J441 Chronic obstructive pulmonary disease with (acute) exacerbation: Secondary | ICD-10-CM | POA: Diagnosis not present

## 2017-11-26 DIAGNOSIS — G2581 Restless legs syndrome: Secondary | ICD-10-CM | POA: Diagnosis not present

## 2017-11-26 DIAGNOSIS — J189 Pneumonia, unspecified organism: Secondary | ICD-10-CM | POA: Diagnosis not present

## 2017-11-27 DIAGNOSIS — J441 Chronic obstructive pulmonary disease with (acute) exacerbation: Secondary | ICD-10-CM | POA: Diagnosis not present

## 2017-11-27 DIAGNOSIS — J189 Pneumonia, unspecified organism: Secondary | ICD-10-CM | POA: Diagnosis not present

## 2017-11-27 DIAGNOSIS — G2581 Restless legs syndrome: Secondary | ICD-10-CM | POA: Diagnosis not present

## 2017-11-27 DIAGNOSIS — I2510 Atherosclerotic heart disease of native coronary artery without angina pectoris: Secondary | ICD-10-CM | POA: Diagnosis not present

## 2017-11-28 DIAGNOSIS — J189 Pneumonia, unspecified organism: Secondary | ICD-10-CM | POA: Diagnosis not present

## 2017-11-28 DIAGNOSIS — G2581 Restless legs syndrome: Secondary | ICD-10-CM | POA: Diagnosis not present

## 2017-11-28 DIAGNOSIS — J441 Chronic obstructive pulmonary disease with (acute) exacerbation: Secondary | ICD-10-CM | POA: Diagnosis not present

## 2017-11-28 DIAGNOSIS — I2510 Atherosclerotic heart disease of native coronary artery without angina pectoris: Secondary | ICD-10-CM | POA: Diagnosis not present

## 2017-11-29 DIAGNOSIS — J129 Viral pneumonia, unspecified: Secondary | ICD-10-CM | POA: Diagnosis not present

## 2017-11-29 DIAGNOSIS — I25709 Atherosclerosis of coronary artery bypass graft(s), unspecified, with unspecified angina pectoris: Secondary | ICD-10-CM | POA: Diagnosis not present

## 2017-11-29 DIAGNOSIS — M1990 Unspecified osteoarthritis, unspecified site: Secondary | ICD-10-CM | POA: Diagnosis not present

## 2017-11-29 DIAGNOSIS — J439 Emphysema, unspecified: Secondary | ICD-10-CM | POA: Diagnosis not present

## 2017-11-29 DIAGNOSIS — I10 Essential (primary) hypertension: Secondary | ICD-10-CM | POA: Diagnosis not present

## 2017-11-29 DIAGNOSIS — G619 Inflammatory polyneuropathy, unspecified: Secondary | ICD-10-CM | POA: Diagnosis not present

## 2017-11-29 DIAGNOSIS — E782 Mixed hyperlipidemia: Secondary | ICD-10-CM | POA: Diagnosis not present

## 2017-12-10 DIAGNOSIS — G619 Inflammatory polyneuropathy, unspecified: Secondary | ICD-10-CM | POA: Diagnosis not present

## 2017-12-10 DIAGNOSIS — E782 Mixed hyperlipidemia: Secondary | ICD-10-CM | POA: Diagnosis not present

## 2017-12-10 DIAGNOSIS — M1990 Unspecified osteoarthritis, unspecified site: Secondary | ICD-10-CM | POA: Diagnosis not present

## 2017-12-10 DIAGNOSIS — J439 Emphysema, unspecified: Secondary | ICD-10-CM | POA: Diagnosis not present

## 2017-12-10 DIAGNOSIS — J129 Viral pneumonia, unspecified: Secondary | ICD-10-CM | POA: Diagnosis not present

## 2017-12-10 DIAGNOSIS — I25709 Atherosclerosis of coronary artery bypass graft(s), unspecified, with unspecified angina pectoris: Secondary | ICD-10-CM | POA: Diagnosis not present

## 2017-12-10 DIAGNOSIS — I10 Essential (primary) hypertension: Secondary | ICD-10-CM | POA: Diagnosis not present

## 2017-12-10 DIAGNOSIS — F17210 Nicotine dependence, cigarettes, uncomplicated: Secondary | ICD-10-CM | POA: Diagnosis not present

## 2017-12-14 DIAGNOSIS — G619 Inflammatory polyneuropathy, unspecified: Secondary | ICD-10-CM | POA: Diagnosis not present

## 2017-12-14 DIAGNOSIS — Z713 Dietary counseling and surveillance: Secondary | ICD-10-CM | POA: Diagnosis not present

## 2017-12-14 DIAGNOSIS — I25709 Atherosclerosis of coronary artery bypass graft(s), unspecified, with unspecified angina pectoris: Secondary | ICD-10-CM | POA: Diagnosis not present

## 2017-12-14 DIAGNOSIS — Z23 Encounter for immunization: Secondary | ICD-10-CM | POA: Diagnosis not present

## 2017-12-14 DIAGNOSIS — Z7182 Exercise counseling: Secondary | ICD-10-CM | POA: Diagnosis not present

## 2017-12-14 DIAGNOSIS — Z6825 Body mass index (BMI) 25.0-25.9, adult: Secondary | ICD-10-CM | POA: Diagnosis not present

## 2017-12-14 DIAGNOSIS — I10 Essential (primary) hypertension: Secondary | ICD-10-CM | POA: Diagnosis not present

## 2017-12-14 DIAGNOSIS — M1990 Unspecified osteoarthritis, unspecified site: Secondary | ICD-10-CM | POA: Diagnosis not present

## 2017-12-14 DIAGNOSIS — E782 Mixed hyperlipidemia: Secondary | ICD-10-CM | POA: Diagnosis not present

## 2017-12-14 DIAGNOSIS — J439 Emphysema, unspecified: Secondary | ICD-10-CM | POA: Diagnosis not present

## 2017-12-17 DIAGNOSIS — Z87891 Personal history of nicotine dependence: Secondary | ICD-10-CM | POA: Diagnosis not present

## 2017-12-17 DIAGNOSIS — J441 Chronic obstructive pulmonary disease with (acute) exacerbation: Secondary | ICD-10-CM | POA: Diagnosis not present

## 2017-12-17 DIAGNOSIS — Z951 Presence of aortocoronary bypass graft: Secondary | ICD-10-CM | POA: Diagnosis not present

## 2017-12-17 DIAGNOSIS — Z7982 Long term (current) use of aspirin: Secondary | ICD-10-CM | POA: Diagnosis not present

## 2017-12-17 DIAGNOSIS — I25709 Atherosclerosis of coronary artery bypass graft(s), unspecified, with unspecified angina pectoris: Secondary | ICD-10-CM | POA: Diagnosis not present

## 2017-12-17 DIAGNOSIS — I25810 Atherosclerosis of coronary artery bypass graft(s) without angina pectoris: Secondary | ICD-10-CM | POA: Diagnosis not present

## 2017-12-17 DIAGNOSIS — J129 Viral pneumonia, unspecified: Secondary | ICD-10-CM | POA: Diagnosis not present

## 2017-12-17 DIAGNOSIS — E782 Mixed hyperlipidemia: Secondary | ICD-10-CM | POA: Diagnosis not present

## 2017-12-17 DIAGNOSIS — J80 Acute respiratory distress syndrome: Secondary | ICD-10-CM | POA: Diagnosis not present

## 2017-12-17 DIAGNOSIS — M1990 Unspecified osteoarthritis, unspecified site: Secondary | ICD-10-CM | POA: Diagnosis not present

## 2017-12-17 DIAGNOSIS — I252 Old myocardial infarction: Secondary | ICD-10-CM | POA: Diagnosis not present

## 2017-12-17 DIAGNOSIS — I10 Essential (primary) hypertension: Secondary | ICD-10-CM | POA: Diagnosis not present

## 2017-12-17 DIAGNOSIS — J40 Bronchitis, not specified as acute or chronic: Secondary | ICD-10-CM | POA: Diagnosis not present

## 2017-12-17 DIAGNOSIS — R0602 Shortness of breath: Secondary | ICD-10-CM | POA: Diagnosis not present

## 2017-12-17 DIAGNOSIS — G619 Inflammatory polyneuropathy, unspecified: Secondary | ICD-10-CM | POA: Diagnosis not present

## 2017-12-17 DIAGNOSIS — Z79899 Other long term (current) drug therapy: Secondary | ICD-10-CM | POA: Diagnosis not present

## 2017-12-17 DIAGNOSIS — Z885 Allergy status to narcotic agent status: Secondary | ICD-10-CM | POA: Diagnosis not present

## 2017-12-17 DIAGNOSIS — J439 Emphysema, unspecified: Secondary | ICD-10-CM | POA: Diagnosis not present

## 2018-01-11 DIAGNOSIS — Z87891 Personal history of nicotine dependence: Secondary | ICD-10-CM | POA: Diagnosis not present

## 2018-01-11 DIAGNOSIS — J441 Chronic obstructive pulmonary disease with (acute) exacerbation: Secondary | ICD-10-CM | POA: Diagnosis not present

## 2018-01-11 DIAGNOSIS — Z9114 Patient's other noncompliance with medication regimen: Secondary | ICD-10-CM | POA: Diagnosis not present

## 2018-01-11 DIAGNOSIS — I2510 Atherosclerotic heart disease of native coronary artery without angina pectoris: Secondary | ICD-10-CM | POA: Diagnosis not present

## 2018-01-11 DIAGNOSIS — I10 Essential (primary) hypertension: Secondary | ICD-10-CM | POA: Diagnosis not present

## 2018-01-11 DIAGNOSIS — R0602 Shortness of breath: Secondary | ICD-10-CM | POA: Diagnosis not present

## 2018-01-11 DIAGNOSIS — A419 Sepsis, unspecified organism: Secondary | ICD-10-CM | POA: Diagnosis not present

## 2018-01-11 DIAGNOSIS — I499 Cardiac arrhythmia, unspecified: Secondary | ICD-10-CM | POA: Diagnosis not present

## 2020-09-20 DEATH — deceased
